# Patient Record
Sex: Male | Born: 1945 | Race: White | Hispanic: No | Smoking: Former smoker
Health system: Southern US, Community
[De-identification: ages and names within clinical notes are randomized; demographics above are authoritative.]

## PROBLEM LIST (undated history)

## (undated) DIAGNOSIS — K219 Gastro-esophageal reflux disease without esophagitis: Secondary | ICD-10-CM

## (undated) DIAGNOSIS — M4802 Spinal stenosis, cervical region: Secondary | ICD-10-CM

## (undated) DIAGNOSIS — D369 Benign neoplasm, unspecified site: Secondary | ICD-10-CM

## (undated) DIAGNOSIS — E119 Type 2 diabetes mellitus without complications: Secondary | ICD-10-CM

## (undated) DIAGNOSIS — C61 Malignant neoplasm of prostate: Secondary | ICD-10-CM

## (undated) DIAGNOSIS — K5790 Diverticulosis of intestine, part unspecified, without perforation or abscess without bleeding: Secondary | ICD-10-CM

## (undated) DIAGNOSIS — G629 Polyneuropathy, unspecified: Secondary | ICD-10-CM

## (undated) DIAGNOSIS — N529 Male erectile dysfunction, unspecified: Secondary | ICD-10-CM

## (undated) DIAGNOSIS — E785 Hyperlipidemia, unspecified: Secondary | ICD-10-CM

## (undated) DIAGNOSIS — H9319 Tinnitus, unspecified ear: Secondary | ICD-10-CM

## (undated) DIAGNOSIS — N400 Enlarged prostate without lower urinary tract symptoms: Secondary | ICD-10-CM

## (undated) DIAGNOSIS — Z86010 Personal history of colonic polyps: Secondary | ICD-10-CM

## (undated) DIAGNOSIS — B009 Herpesviral infection, unspecified: Secondary | ICD-10-CM

## (undated) DIAGNOSIS — J449 Chronic obstructive pulmonary disease, unspecified: Secondary | ICD-10-CM

## (undated) DIAGNOSIS — N419 Inflammatory disease of prostate, unspecified: Secondary | ICD-10-CM

## (undated) HISTORY — DX: Hyperlipidemia, unspecified: E78.5

## (undated) HISTORY — DX: Malignant neoplasm of prostate: C61

## (undated) HISTORY — DX: Diverticulosis of intestine, part unspecified, without perforation or abscess without bleeding: K57.90

## (undated) HISTORY — DX: Chronic obstructive pulmonary disease, unspecified: J44.9

## (undated) HISTORY — DX: Benign neoplasm, unspecified site: D36.9

## (undated) HISTORY — DX: Male erectile dysfunction, unspecified: N52.9

## (undated) HISTORY — DX: Polyneuropathy, unspecified: G62.9

## (undated) HISTORY — DX: Inflammatory disease of prostate, unspecified: N41.9

## (undated) HISTORY — DX: Gastro-esophageal reflux disease without esophagitis: K21.9

## (undated) HISTORY — DX: Spinal stenosis, cervical region: M48.02

## (undated) HISTORY — DX: Tinnitus, unspecified ear: H93.19

## (undated) HISTORY — DX: Type 2 diabetes mellitus without complications: E11.9

## (undated) HISTORY — DX: Herpesviral infection, unspecified: B00.9

## (undated) HISTORY — PX: COLONOSCOPY: SHX174

## (undated) HISTORY — DX: Benign prostatic hyperplasia without lower urinary tract symptoms: N40.0

---
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
## 2000-12-01 ENCOUNTER — Encounter: Payer: Self-pay | Admitting: Family Medicine

## 2000-12-01 ENCOUNTER — Encounter: Admission: RE | Admit: 2000-12-01 | Discharge: 2000-12-01 | Payer: Self-pay | Admitting: Family Medicine

## 2003-02-09 DIAGNOSIS — Z86010 Personal history of colonic polyps: Secondary | ICD-10-CM

## 2003-02-09 DIAGNOSIS — Z860101 Personal history of adenomatous and serrated colon polyps: Secondary | ICD-10-CM

## 2003-02-09 HISTORY — DX: Personal history of adenomatous and serrated colon polyps: Z86.0101

## 2003-02-09 HISTORY — DX: Personal history of colonic polyps: Z86.010

## 2008-07-02 ENCOUNTER — Encounter (INDEPENDENT_AMBULATORY_CARE_PROVIDER_SITE_OTHER): Payer: Self-pay | Admitting: *Deleted

## 2008-08-26 ENCOUNTER — Telehealth: Payer: Self-pay | Admitting: Internal Medicine

## 2008-08-26 ENCOUNTER — Ambulatory Visit: Payer: Self-pay | Admitting: Internal Medicine

## 2008-09-09 ENCOUNTER — Ambulatory Visit: Payer: Self-pay | Admitting: Internal Medicine

## 2008-09-09 ENCOUNTER — Encounter: Payer: Self-pay | Admitting: Internal Medicine

## 2008-09-12 ENCOUNTER — Encounter: Payer: Self-pay | Admitting: Internal Medicine

## 2009-06-24 ENCOUNTER — Encounter: Admission: RE | Admit: 2009-06-24 | Discharge: 2009-06-24 | Payer: Self-pay | Admitting: Family Medicine

## 2009-09-23 ENCOUNTER — Encounter: Admission: RE | Admit: 2009-09-23 | Discharge: 2009-09-23 | Payer: Self-pay | Admitting: Family Medicine

## 2009-09-25 ENCOUNTER — Encounter: Admission: RE | Admit: 2009-09-25 | Discharge: 2009-09-25 | Payer: Self-pay | Admitting: Family Medicine

## 2009-10-02 ENCOUNTER — Encounter: Admission: RE | Admit: 2009-10-02 | Discharge: 2009-10-02 | Payer: Self-pay | Admitting: Family Medicine

## 2010-02-08 DIAGNOSIS — L570 Actinic keratosis: Secondary | ICD-10-CM

## 2010-02-08 HISTORY — DX: Actinic keratosis: L57.0

## 2011-10-22 ENCOUNTER — Ambulatory Visit
Admission: RE | Admit: 2011-10-22 | Discharge: 2011-10-22 | Disposition: A | Discharge status: Home / Self Care | Payer: Self-pay | Source: Ambulatory Visit | Attending: Family Medicine | Admitting: Family Medicine

## 2011-10-22 ENCOUNTER — Other Ambulatory Visit: Payer: Self-pay | Admitting: Family Medicine

## 2011-10-22 DIAGNOSIS — M79672 Pain in left foot: Secondary | ICD-10-CM

## 2012-03-27 ENCOUNTER — Other Ambulatory Visit: Payer: Self-pay | Admitting: Family Medicine

## 2012-03-27 DIAGNOSIS — Z122 Encounter for screening for malignant neoplasm of respiratory organs: Secondary | ICD-10-CM

## 2012-03-30 ENCOUNTER — Ambulatory Visit
Admission: RE | Admit: 2012-03-30 | Discharge: 2012-03-30 | Disposition: A | Discharge status: Home / Self Care | Payer: 59 | Source: Ambulatory Visit | Attending: Family Medicine | Admitting: Family Medicine

## 2012-03-30 DIAGNOSIS — Z122 Encounter for screening for malignant neoplasm of respiratory organs: Secondary | ICD-10-CM

## 2013-04-27 ENCOUNTER — Ambulatory Visit
Admission: RE | Admit: 2013-04-27 | Discharge: 2013-04-27 | Disposition: A | Discharge status: Home / Self Care | Payer: 59 | Source: Ambulatory Visit | Attending: Family Medicine | Admitting: Family Medicine

## 2013-04-27 ENCOUNTER — Other Ambulatory Visit: Payer: Self-pay | Admitting: Family Medicine

## 2013-04-27 DIAGNOSIS — M79671 Pain in right foot: Secondary | ICD-10-CM

## 2013-10-22 ENCOUNTER — Encounter: Payer: Self-pay | Admitting: Internal Medicine

## 2013-11-29 ENCOUNTER — Other Ambulatory Visit: Payer: Self-pay | Admitting: Family Medicine

## 2013-11-29 DIAGNOSIS — M5412 Radiculopathy, cervical region: Secondary | ICD-10-CM

## 2013-11-29 DIAGNOSIS — M5416 Radiculopathy, lumbar region: Secondary | ICD-10-CM

## 2013-12-05 ENCOUNTER — Ambulatory Visit
Admission: RE | Admit: 2013-12-05 | Discharge: 2013-12-05 | Disposition: A | Discharge status: Home / Self Care | Payer: 59 | Source: Ambulatory Visit | Attending: Family Medicine | Admitting: Family Medicine

## 2013-12-05 ENCOUNTER — Other Ambulatory Visit: Payer: Self-pay | Admitting: Family Medicine

## 2013-12-05 DIAGNOSIS — Z139 Encounter for screening, unspecified: Secondary | ICD-10-CM

## 2013-12-07 ENCOUNTER — Other Ambulatory Visit: Payer: Self-pay

## 2013-12-07 ENCOUNTER — Ambulatory Visit
Admission: RE | Admit: 2013-12-07 | Discharge: 2013-12-07 | Disposition: A | Discharge status: Home / Self Care | Payer: 59 | Source: Ambulatory Visit | Attending: Family Medicine | Admitting: Family Medicine

## 2013-12-07 DIAGNOSIS — M5412 Radiculopathy, cervical region: Secondary | ICD-10-CM

## 2013-12-07 DIAGNOSIS — M5416 Radiculopathy, lumbar region: Secondary | ICD-10-CM

## 2013-12-14 ENCOUNTER — Other Ambulatory Visit: Payer: Self-pay | Admitting: Family Medicine

## 2013-12-14 DIAGNOSIS — M4802 Spinal stenosis, cervical region: Secondary | ICD-10-CM

## 2013-12-14 DIAGNOSIS — M5412 Radiculopathy, cervical region: Secondary | ICD-10-CM

## 2013-12-17 ENCOUNTER — Other Ambulatory Visit: Payer: Self-pay | Admitting: Family Medicine

## 2013-12-17 DIAGNOSIS — M5416 Radiculopathy, lumbar region: Secondary | ICD-10-CM

## 2013-12-19 ENCOUNTER — Ambulatory Visit
Admission: RE | Admit: 2013-12-19 | Discharge: 2013-12-19 | Disposition: A | Discharge status: Home / Self Care | Payer: 59 | Source: Ambulatory Visit | Attending: Family Medicine | Admitting: Family Medicine

## 2013-12-19 DIAGNOSIS — M5412 Radiculopathy, cervical region: Secondary | ICD-10-CM

## 2013-12-19 DIAGNOSIS — M4802 Spinal stenosis, cervical region: Secondary | ICD-10-CM

## 2013-12-19 MED ORDER — IOHEXOL 300 MG/ML  SOLN
1.0000 mL | Freq: Once | INTRAMUSCULAR | Status: AC | PRN
  Administered 2013-12-19: 1 mL via EPIDURAL

## 2013-12-19 MED ORDER — TRIAMCINOLONE ACETONIDE 40 MG/ML IJ SUSP (RADIOLOGY)
60.0000 mg | Freq: Once | INTRAMUSCULAR | Status: AC
  Administered 2013-12-19: 60 mg via EPIDURAL

## 2013-12-19 NOTE — Discharge Instructions (Signed)

## 2014-01-07 ENCOUNTER — Ambulatory Visit
Admission: RE | Admit: 2014-01-07 | Discharge: 2014-01-07 | Disposition: A | Discharge status: Home / Self Care | Payer: 59 | Source: Ambulatory Visit | Attending: Family Medicine | Admitting: Family Medicine

## 2014-01-07 DIAGNOSIS — M5416 Radiculopathy, lumbar region: Secondary | ICD-10-CM

## 2014-01-07 MED ORDER — METHYLPREDNISOLONE ACETATE 40 MG/ML INJ SUSP (RADIOLOG
120.0000 mg | Freq: Once | INTRAMUSCULAR | Status: AC
  Administered 2014-01-07: 120 mg via EPIDURAL

## 2014-01-07 MED ORDER — IOHEXOL 180 MG/ML  SOLN
1.0000 mL | Freq: Once | INTRAMUSCULAR | Status: AC | PRN
Start: 2014-01-07 — End: 2014-01-07
  Administered 2014-01-07: 1 mL via EPIDURAL

## 2016-03-02 ENCOUNTER — Other Ambulatory Visit: Payer: Self-pay | Admitting: Family Medicine

## 2016-03-02 DIAGNOSIS — G8929 Other chronic pain: Secondary | ICD-10-CM

## 2016-03-02 DIAGNOSIS — M533 Sacrococcygeal disorders, not elsewhere classified: Principal | ICD-10-CM

## 2016-03-17 ENCOUNTER — Ambulatory Visit
Admission: RE | Admit: 2016-03-17 | Discharge: 2016-03-17 | Disposition: A | Discharge status: Home / Self Care | Payer: 59 | Source: Ambulatory Visit | Attending: Family Medicine | Admitting: Family Medicine

## 2016-03-17 DIAGNOSIS — G8929 Other chronic pain: Secondary | ICD-10-CM

## 2016-03-17 DIAGNOSIS — M533 Sacrococcygeal disorders, not elsewhere classified: Principal | ICD-10-CM

## 2016-03-17 MED ORDER — IOPAMIDOL (ISOVUE-M 200) INJECTION 41%
1.0000 mL | Freq: Once | INTRAMUSCULAR | Status: AC
  Administered 2016-03-17: 1 mL via INTRA_ARTICULAR

## 2016-03-17 MED ORDER — METHYLPREDNISOLONE ACETATE 40 MG/ML INJ SUSP (RADIOLOG
120.0000 mg | Freq: Once | INTRAMUSCULAR | Status: AC
  Administered 2016-03-17: 120 mg via INTRA_ARTICULAR

## 2016-03-17 NOTE — Discharge Instructions (Signed)

## 2016-12-15 ENCOUNTER — Other Ambulatory Visit: Payer: Self-pay | Admitting: Family Medicine

## 2016-12-15 ENCOUNTER — Ambulatory Visit
Admission: RE | Admit: 2016-12-15 | Discharge: 2016-12-15 | Disposition: A | Discharge status: Home / Self Care | Payer: 59 | Source: Ambulatory Visit | Attending: Family Medicine | Admitting: Family Medicine

## 2016-12-15 DIAGNOSIS — M25471 Effusion, right ankle: Secondary | ICD-10-CM

## 2017-12-20 ENCOUNTER — Other Ambulatory Visit: Payer: Self-pay | Admitting: Urology

## 2017-12-20 DIAGNOSIS — R972 Elevated prostate specific antigen [PSA]: Secondary | ICD-10-CM

## 2018-01-10 ENCOUNTER — Ambulatory Visit
Admission: RE | Admit: 2018-01-10 | Discharge: 2018-01-10 | Disposition: A | Discharge status: Home / Self Care | Payer: 59 | Source: Ambulatory Visit | Attending: Urology | Admitting: Urology

## 2018-01-10 DIAGNOSIS — R972 Elevated prostate specific antigen [PSA]: Secondary | ICD-10-CM

## 2018-01-10 MED ORDER — GADOBENATE DIMEGLUMINE 529 MG/ML IV SOLN
16.0000 mL | Freq: Once | INTRAVENOUS | Status: AC | PRN
  Administered 2018-01-10: 16 mL via INTRAVENOUS

## 2018-03-15 ENCOUNTER — Ambulatory Visit
Admission: RE | Admit: 2018-03-15 | Discharge: 2018-03-15 | Disposition: A | Discharge status: Home / Self Care | Payer: 59 | Source: Ambulatory Visit | Attending: Family Medicine | Admitting: Family Medicine

## 2018-03-15 ENCOUNTER — Other Ambulatory Visit: Payer: Self-pay | Admitting: Family Medicine

## 2018-03-15 DIAGNOSIS — R059 Cough, unspecified: Secondary | ICD-10-CM

## 2018-03-15 DIAGNOSIS — R05 Cough: Secondary | ICD-10-CM

## 2018-09-12 ENCOUNTER — Encounter: Payer: Self-pay | Admitting: Internal Medicine

## 2018-09-25 ENCOUNTER — Encounter: Payer: Self-pay | Admitting: Internal Medicine

## 2018-10-03 ENCOUNTER — Other Ambulatory Visit: Payer: Self-pay

## 2018-10-03 ENCOUNTER — Ambulatory Visit (AMBULATORY_SURGERY_CENTER): Payer: Self-pay | Admitting: *Deleted

## 2018-10-03 VITALS — Temp 96.9°F | Ht 68.0 in | Wt 179.0 lb

## 2018-10-03 DIAGNOSIS — Z1211 Encounter for screening for malignant neoplasm of colon: Secondary | ICD-10-CM

## 2018-10-03 DIAGNOSIS — Z1212 Encounter for screening for malignant neoplasm of rectum: Secondary | ICD-10-CM

## 2018-10-03 NOTE — Progress Notes (Signed)
No egg or soy allergy known to patient  No issues with past sedation with any surgeries  or procedures, no intubation problems  No diet pills per patient No home 02 use per patient  No blood thinners per patient  Pt denies issues with constipation  No A fib or A flutter  EMMI video declined - pt has e mail

## 2018-10-09 ENCOUNTER — Encounter: Payer: Self-pay | Admitting: Internal Medicine

## 2018-10-16 ENCOUNTER — Telehealth: Payer: Self-pay

## 2018-10-16 NOTE — Telephone Encounter (Signed)
Patient answered "no" to all questions.  

## 2018-10-16 NOTE — Telephone Encounter (Signed)
Covid-19 screening questions   Do you now or have you had a fever in the last 14 days?  Do you have any respiratory symptoms of shortness of breath or cough now or in the last 14 days?  Do you have any family members or close contacts with diagnosed or suspected Covid-19 in the past 14 days?  Have you been tested for Covid-19 and found to be positive?       

## 2018-10-17 ENCOUNTER — Encounter: Payer: Self-pay | Admitting: Internal Medicine

## 2018-10-17 ENCOUNTER — Other Ambulatory Visit: Payer: Self-pay

## 2018-10-17 ENCOUNTER — Ambulatory Visit (AMBULATORY_SURGERY_CENTER): Payer: 59 | Admitting: Internal Medicine

## 2018-10-17 VITALS — BP 159/93 | HR 49 | Temp 97.6°F | Resp 21 | Ht 68.0 in | Wt 179.0 lb

## 2018-10-17 DIAGNOSIS — D123 Benign neoplasm of transverse colon: Secondary | ICD-10-CM

## 2018-10-17 DIAGNOSIS — Z86010 Personal history of colonic polyps: Secondary | ICD-10-CM | POA: Diagnosis present

## 2018-10-17 MED ORDER — SODIUM CHLORIDE 0.9 % IV SOLN: 500.0000 mL | Freq: Once | INTRAVENOUS | Status: DC

## 2018-10-17 NOTE — Op Note (Signed)
[facility] Patient Name: Sean Robertson Procedure Date: 10/17/2018 9:24 AM MRN: [id number] Endoscopist: Gatha Mayer , MD Age: 73 Referring MD:  Date of Birth: [birth year] Gender: Male Account #: [id number] Procedure:                Colonoscopy Indications:              Surveillance: Personal history of adenomatous                            polyps on last colonoscopy > 5 years ago Medicines:                Propofol per Anesthesia, Monitored Anesthesia Care Procedure:                Pre-Anesthesia Assessment:                           - Prior to the procedure, a History and Physical                            was performed, and patient medications and                            allergies were reviewed. The patient's tolerance of                            previous anesthesia was also reviewed. The risks                            and benefits of the procedure and the sedation                            options and risks were discussed with the patient.                            All questions were answered, and informed consent                            was obtained. Prior Anticoagulants: The patient has                            taken no previous anticoagulant or antiplatelet                            agents. ASA Grade Assessment: II - A patient with                            mild systemic disease. After reviewing the risks                            and benefits, the patient was deemed in                            satisfactory condition to undergo the procedure.  After obtaining informed consent, the colonoscope                            was passed under direct vision. Throughout the                            procedure, the patient's blood pressure, pulse, and                            oxygen saturations were monitored continuously. The                            Colonoscope was introduced through the anus and   advanced to the the cecum, identified by                            appendiceal orifice and ileocecal valve. The                            colonoscopy was performed without difficulty. The                            patient tolerated the procedure well. The quality                            of the bowel preparation was excellent. The bowel                            preparation used was Miralax via split dose                            instruction. The ileocecal valve, appendiceal                            orifice, and rectum were photographed. Scope In: 9:30:48 AM Scope Out: 9:39:29 AM Scope Withdrawal Time: 0 hours 6 minutes 14 seconds  Total Procedure Duration: 0 hours 8 minutes 41 seconds  Findings:                 The digital rectal exam findings include enlarged                            prostate. Pertinent negatives include no palpable                            rectal lesions.                           A diminutive polyp was found in the proximal                            transverse colon. The polyp was sessile. The polyp                            was removed with a cold snare. Resection  and                            retrieval were complete. Verification of patient                            identification for the specimen was done. Estimated                            blood loss was minimal.                           Multiple diverticula were found in the sigmoid                            colon.                           External and internal hemorrhoids were found.                           Skin tags were found on perianal exam.                           The exam was otherwise without abnormality on                            direct and retroflexion views. Complications:            No immediate complications. Estimated Blood Loss:     Estimated blood loss was minimal. Impression:               - Enlarged prostate found on digital rectal exam.                             F/U PCP and/or urology                           - One diminutive polyp in the proximal transverse                            colon, removed with a cold snare. Resected and                            retrieved.                           - Diverticulosis in the sigmoid colon.                           - External and internal hemorrhoids.                           - Perianal skin tags found on perianal exam.                           - The examination was otherwise normal on direct  and retroflexion views.                           - Personal history of colonic polyps. 2 diminutive                            adenomas 20-05, no polyps 2010 Recommendation:           - Patient has a contact number available for                            emergencies. The signs and symptoms of potential                            delayed complications were discussed with the                            patient. Return to normal activities tomorrow.                            Written discharge instructions were provided to the                            patient.                           - Resume previous diet.                           - Continue present medications.                           - No recommendation at this time regarding repeat                            colonoscopy due to age. Anticipate tyhat he will                            not need a routine repeat at his age and with 1                            diminutive polyp today - will let him know. Gatha Mayer, MD 10/17/2018 9:48:55 AM This report has been signed electronically.

## 2018-10-17 NOTE — Progress Notes (Signed)
Report to PACU, RN, vss, BBS= Clear.  

## 2018-10-17 NOTE — Patient Instructions (Addendum)
I found and removed one tiny polyp. I will let you know pathology results and if/when to have another routine colonoscopy by mail and/or My Chart. I am thinking I will not likely recommend a repeat colonoscopy.  You do have diverticulosis - thickened muscle rings and pouches in the colon wall. Please read the handout about this condition.  I also saw swollen hemorrhoids, which could be from the prep. If you have hemorrhoid problems (swelling, itching, bleeding) I am able to treat those with an in-office procedure. If you like, please call my office at [phone] to schedule an appointment and I can evaluate you further.  Handouts given for hemorrhoids and hemorrhoid removal, polyps, and diverticulosis.   YOU HAD AN ENDOSCOPIC PROCEDURE TODAY AT [facility]:   Refer to the procedure report that was given to you for any specific questions about what was found during the examination.  If the procedure report does not answer your questions, please call your gastroenterologist to clarify.  If you requested that your care partner not be given the details of your procedure findings, then the procedure report has been included in a sealed envelope for you to review at your convenience later.  YOU SHOULD EXPECT: Some feelings of bloating in the abdomen. Passage of more gas than usual.  Walking can help get rid of the air that was put into your GI tract during the procedure and reduce the bloating. If you had a lower endoscopy (such as a colonoscopy or flexible sigmoidoscopy) you may notice spotting of blood in your stool or on the toilet paper. If you underwent a bowel prep for your procedure, you may not have a normal bowel movement for a few days.  Please Note:  You might notice some irritation and congestion in your nose or some drainage.  This is from the oxygen used during your procedure.  There is no need for concern and it should clear up in a day or so.  SYMPTOMS TO REPORT  IMMEDIATELY:   Following lower endoscopy (colonoscopy or flexible sigmoidoscopy):  Excessive amounts of blood in the stool  Significant tenderness or worsening of abdominal pains  Swelling of the abdomen that is new, acute  Fever of 100F or higher  For urgent or emergent issues, a gastroenterologist can be reached at any hour by calling [phone].   DIET:  We do recommend a small meal at first, but then you may proceed to your regular diet.  Drink plenty of fluids but you should avoid alcoholic beverages for 24 hours.  ACTIVITY:  You should plan to take it easy for the rest of today and you should NOT DRIVE or use heavy machinery until tomorrow (because of the sedation medicines used during the test).    FOLLOW UP: Our staff will call the number listed on your records 48-72 hours following your procedure to check on you and address any questions or concerns that you may have regarding the information given to you following your procedure. If we do not reach you, we will leave a message.  We will attempt to reach you two times.  During this call, we will ask if you have developed any symptoms of COVID 19. If you develop any symptoms (ie: fever, flu-like symptoms, shortness of breath, cough etc.) before then, please call [phone].  If you test positive for Covid 19 in the 2 weeks post procedure, please call and report this information to [name].    If any biopsies were  taken you will be contacted by phone or by letter within the next 1-3 weeks.  Please call us at [phone] if you have not heard about the biopsies in 3 weeks.    SIGNATURES/CONFIDENTIALITY: You and/or your care partner have signed paperwork which will be entered into your electronic medical record.  These signatures attest to the fact that that the information above on your After Visit Summary has been reviewed and is understood.  Full responsibility of the confidentiality of this discharge information lies with you  and/or your care-partner.

## 2018-10-17 NOTE — Progress Notes (Signed)
Pt's states no medical or surgical changes since previsit or office visit.  AM - temp CW - vitals 

## 2018-10-17 NOTE — Progress Notes (Signed)
Called to room to assist during endoscopic procedure.  Patient ID and intended procedure confirmed with present staff. Received instructions for my participation in the procedure from the performing physician.  

## 2018-10-19 ENCOUNTER — Encounter: Payer: Self-pay | Admitting: Internal Medicine

## 2018-10-19 ENCOUNTER — Telehealth: Payer: Self-pay

## 2018-10-19 NOTE — Telephone Encounter (Signed)
  Follow up Call-  Call back number 10/17/2018  Post procedure Call Back phone  # [phone]  Permission to leave phone message Yes  Some recent data might be hidden     Patient questions:  Do you have a fever, pain , or abdominal swelling? No. Pain Score  0 *  Have you tolerated food without any problems? Yes.    Have you been able to return to your normal activities? Yes.    Do you have any questions about your discharge instructions: Diet   No. Medications  No. Follow up visit  No.  Do you have questions or concerns about your Care? No.  Actions: * If pain score is 4 or above: No action needed, pain <4.  1. Have you developed a fever since your procedure? Mo.  2.   Have you had an respiratory symptoms (SOB or cough) since your procedure? No.  3.   Have you tested positive for COVID 19 since your procedure no.  4.   Have you had any family members/close contacts diagnosed with the COVID 19 since your procedure?  No.   If yes to any of these questions please route to Joylene John, RN and Alphonsa Gin, RN.

## 2018-10-19 NOTE — Progress Notes (Signed)
Diminutive adenoma No recall due to age and only 1 diminutive adenoma

## 2020-06-09 ENCOUNTER — Telehealth: Payer: Self-pay | Admitting: Internal Medicine

## 2020-07-01 ENCOUNTER — Other Ambulatory Visit: Payer: Self-pay

## 2020-07-11 ENCOUNTER — Ambulatory Visit: Payer: 59 | Admitting: Internal Medicine

## 2020-07-11 ENCOUNTER — Encounter: Payer: Self-pay | Admitting: Internal Medicine

## 2020-07-11 VITALS — BP 150/60 | HR 68 | Ht 68.0 in | Wt 173.4 lb

## 2020-07-11 DIAGNOSIS — R110 Nausea: Secondary | ICD-10-CM

## 2020-07-11 DIAGNOSIS — R195 Other fecal abnormalities: Secondary | ICD-10-CM

## 2020-07-11 DIAGNOSIS — R630 Anorexia: Secondary | ICD-10-CM

## 2020-07-11 DIAGNOSIS — N289 Disorder of kidney and ureter, unspecified: Secondary | ICD-10-CM

## 2020-07-11 DIAGNOSIS — R1013 Epigastric pain: Secondary | ICD-10-CM

## 2020-07-11 DIAGNOSIS — Z636 Dependent relative needing care at home: Secondary | ICD-10-CM
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
## 2020-07-11 NOTE — Patient Instructions (Signed)
You have been scheduled for an endoscopy. Please follow written instructions given to you at your visit today. If you use inhalers (even only as needed), please bring them with you on the day of your procedure.  Due to recent changes in healthcare laws, you may see the results of your imaging and laboratory studies on MyChart before your provider has had a chance to review them.  We understand that in some cases there may be results that are confusing or concerning to you. Not all laboratory results come back in the same time frame and the provider may be waiting for multiple results in order to interpret others.  Please give [name] 48 hours in order for your provider to thoroughly review all the results before contacting the office for clarification of your results.   If you are age 20 or older, your body mass index should be between 23-30. Your Body mass index is 26.36 kg/m. If this is out of the aforementioned range listed, please consider follow up with your Primary Care Provider.  The [facility] providers would like to encourage you to use [facility] to communicate with providers for non-urgent requests or questions.  Due to long hold times on the telephone, sending your provider a message by [facility] may be a faster and more efficient way to get a response.  Please allow 48 business hours for a response.  Please remember that this is for non-urgent requests.   I appreciate the opportunity to care for you. Silvano Rusk, MD, [facility]

## 2020-07-14 ENCOUNTER — Telehealth: Payer: Self-pay

## 2020-07-14 DIAGNOSIS — N289 Disorder of kidney and ureter, unspecified: Secondary | ICD-10-CM

## 2020-07-14 NOTE — Telephone Encounter (Signed)
Sean Robertson informed to come for lab work here and his Renal U/S is set up for 07/22/20 arrive at 2:45pm at [facility] , [address] , arrive with a full bladder, don't go to the bathroom one hour prior to appointment. Informed of the $75 no show fee.

## 2020-07-14 NOTE — Telephone Encounter (Signed)
-----   Message from Gatha Mayer, MD sent at 07/11/2020  5:53 PM EDT ----- Regarding: Additional tests As I was dictating I determined he needs the following:  #1 BME T  #2 renal ultrasound  The diagnosis for both of these is worsening renal function which was a diagnosis I used on the visit from today

## 2020-07-15 ENCOUNTER — Other Ambulatory Visit (INDEPENDENT_AMBULATORY_CARE_PROVIDER_SITE_OTHER): Payer: 59

## 2020-07-15 DIAGNOSIS — N289 Disorder of kidney and ureter, unspecified: Secondary | ICD-10-CM

## 2020-07-15 LAB — BASIC METABOLIC PANEL
BUN: 18 mg/dL (ref 6–23)
CO2: 26 mEq/L (ref 19–32)
Calcium: 9.6 mg/dL (ref 8.4–10.5)
Chloride: 99 mEq/L (ref 96–112)
Creatinine, Ser: 1.6 mg/dL — ABNORMAL HIGH (ref 0.40–1.50)
GFR: 42.05 mL/min — ABNORMAL LOW (ref >60.00)
Glucose, Bld: 214 mg/dL — ABNORMAL HIGH (ref 70–99)
Potassium: 3.7 mEq/L (ref 3.5–5.1)
Sodium: 135 mEq/L (ref 135–145)

## 2020-07-22 ENCOUNTER — Ambulatory Visit
Admission: RE | Admit: 2020-07-22 | Discharge: 2020-07-22 | Disposition: A | Discharge status: Home / Self Care | Payer: 59 | Source: Ambulatory Visit | Attending: Internal Medicine | Admitting: Internal Medicine

## 2020-07-22 ENCOUNTER — Encounter: Payer: Self-pay | Admitting: Internal Medicine

## 2020-07-22 DIAGNOSIS — N289 Disorder of kidney and ureter, unspecified: Secondary | ICD-10-CM

## 2020-07-23 ENCOUNTER — Encounter: Payer: Self-pay | Admitting: Certified Registered Nurse Anesthetist

## 2020-07-24 ENCOUNTER — Other Ambulatory Visit: Payer: Self-pay

## 2020-07-24 ENCOUNTER — Ambulatory Visit (AMBULATORY_SURGERY_CENTER): Payer: 59 | Admitting: Internal Medicine

## 2020-07-24 ENCOUNTER — Encounter: Payer: Self-pay | Admitting: Internal Medicine

## 2020-07-24 VITALS — BP 116/62 | HR 60 | Temp 97.1°F | Resp 14 | Ht 68.0 in | Wt 173.0 lb

## 2020-07-24 DIAGNOSIS — K2970 Gastritis, unspecified, without bleeding: Secondary | ICD-10-CM | POA: Insufficient documentation

## 2020-07-24 DIAGNOSIS — B9681 Helicobacter pylori [H. pylori] as the cause of diseases classified elsewhere: Secondary | ICD-10-CM

## 2020-07-24 DIAGNOSIS — R110 Nausea: Secondary | ICD-10-CM

## 2020-07-24 DIAGNOSIS — K2960 Other gastritis without bleeding: Secondary | ICD-10-CM

## 2020-07-24 DIAGNOSIS — R1013 Epigastric pain: Secondary | ICD-10-CM

## 2020-07-24 HISTORY — DX: Gastritis, unspecified, without bleeding: K29.70

## 2020-07-24 HISTORY — DX: Helicobacter pylori (H. pylori) as the cause of diseases classified elsewhere: B96.81

## 2020-07-24 MED ORDER — SODIUM CHLORIDE 0.9 % IV SOLN: 500.0000 mL | Freq: Once | INTRAVENOUS | Status: DC

## 2020-07-24 NOTE — Progress Notes (Signed)
Report given to PACU, vss 

## 2020-07-24 NOTE — Progress Notes (Signed)
Medical history reviewed with no changes noted. VS assessed by S.F, RN

## 2020-07-24 NOTE — Progress Notes (Signed)
Called to room to assist during endoscopic procedure.  Patient ID and intended procedure confirmed with present staff. Received instructions for my participation in the procedure from the performing physician.  

## 2020-07-24 NOTE — Progress Notes (Signed)
0730 Robinul 0.1 mg IV given due large amount of secretions upon assessment.  MD made aware, vss 

## 2020-07-24 NOTE — Op Note (Signed)
[facility] Patient Name: Sean Robertson Procedure Date: 07/24/2020 7:31 AM MRN: [id number] Endoscopist: Gatha Mayer , MD Age: 75 Referring MD:  Date of Birth: [birth year] Gender: Male Account #: [id number] Procedure:                Upper GI endoscopy Indications:              Periumbilical abdominal pain, Dyspepsia, Heartburn,                            Heme positive stool, Anorexia, Nausea Medicines:                Propofol per Anesthesia, Monitored Anesthesia Care Procedure:                Pre-Anesthesia Assessment:                           - Prior to the procedure, a History and Physical                            was performed, and patient medications and                            allergies were reviewed. The patient's tolerance of                            previous anesthesia was also reviewed. The risks                            and benefits of the procedure and the sedation                            options and risks were discussed with the patient.                            All questions were answered, and informed consent                            was obtained. Prior Anticoagulants: The patient has                            taken no previous anticoagulant or antiplatelet                            agents. ASA Grade Assessment: III - A patient with                            severe systemic disease. After reviewing the risks                            and benefits, the patient was deemed in                            satisfactory condition to undergo the procedure.  After obtaining informed consent, the endoscope was                            passed under direct vision. Throughout the                            procedure, the patient's blood pressure, pulse, and                            oxygen saturations were monitored continuously. The                            Endoscope was introduced through the mouth, and                             advanced to the second part of duodenum. The upper                            GI endoscopy was accomplished without difficulty.                            The patient tolerated the procedure well. Scope In: Scope Out: Findings:                 Diffuse mild inflammation characterized by                            congestion (edema), erythema and mucus was found in                            the gastric body and in the gastric antrum.                            Biopsies were taken with a cold forceps for                            histology. Verification of patient identification                            for the specimen was done. Estimated blood loss was                            minimal.                           The exam was otherwise without abnormality.                           The cardia and gastric fundus were normal on                            retroflexion. Complications:            No immediate complications. Estimated Blood Loss:     Estimated blood loss was minimal. Impression:               -  Gastritis. Biopsied.                           - The examination was otherwise normal. Recommendation:           - Patient has a contact number available for                            emergencies. The signs and symptoms of potential                            delayed complications were discussed with the                            patient. Return to normal activities tomorrow.                            Written discharge instructions were provided to the                            patient.                           - Resume previous diet.                           - Continue present medications.                           - Await pathology results.                           - See Dr. Sandi Mariscal regarding abnormal renal                            function.                           Renal US was normal.                           No cause of heme + stool - had colonoscopy  2020.                           ? stress - playing a role Gatha Mayer, MD 07/24/2020 7:55:47 AM This report has been signed electronically.

## 2020-07-24 NOTE — Patient Instructions (Addendum)
HANDOUTS PROVIDED:  GASTRITIS  There is some mild color change to the stomach lining suggesting inflammation called gastritis.  I took biopsies.  I did not see anything bad.   I will let you know.  Please go back to Dr. Sandi Mariscal and follow up regarding mildly abnormal kidney function.  I appreciate the opportunity to care for you. Gatha Mayer, MD, FACG  YOU HAD AN ENDOSCOPIC PROCEDURE TODAY AT [facility]:   Refer to the procedure report that was given to you for any specific questions about what was found during the examination.  If the procedure report does not answer your questions, please call your gastroenterologist to clarify.  If you requested that your care partner not be given the details of your procedure findings, then the procedure report has been included in a sealed envelope for you to review at your convenience later.  YOU SHOULD EXPECT: Some feelings of bloating in the abdomen. Passage of more gas than usual.  Walking can help get rid of the air that was put into your GI tract during the procedure and reduce the bloating.   Please Note:  You might notice some irritation and congestion in your nose or some drainage.  This is from the oxygen used during your procedure.  There is no need for concern and it should clear up in a day or so.  SYMPTOMS TO REPORT IMMEDIATELY:  Following upper endoscopy (EGD)  Vomiting of blood or coffee ground material  New chest pain or pain under the shoulder blades  Painful or persistently difficult swallowing  New shortness of breath  Fever of 100F or higher  Black, tarry-looking stools  For urgent or emergent issues, a gastroenterologist can be reached at any hour by calling [phone]. Do not use MyChart messaging for urgent concerns.    DIET:  We do recommend a small meal at first, but then you may proceed to your regular diet.  Drink plenty of fluids but you should avoid alcoholic beverages for 24  hours.  ACTIVITY:  You should plan to take it easy for the rest of today and you should NOT DRIVE or use heavy machinery until tomorrow (because of the sedation medicines used during the test).    FOLLOW UP: Our staff will call the number listed on your records Monday morning between 7:15 am and 8:15 am following your procedure to check on you and address any questions or concerns that you may have regarding the information given to you following your procedure. If we do not reach you, we will leave a message.  We will attempt to reach you two times.  During this call, we will ask if you have developed any symptoms of COVID 19. If you develop any symptoms (ie: fever, flu-like symptoms, shortness of breath, cough etc.) before then, please call [phone].  If you test positive for Covid 19 in the 2 weeks post procedure, please call and report this information to [name].    If any biopsies were taken you will be contacted by phone or by letter within the next 1-3 weeks.  Please call us at [phone] if you have not heard about the biopsies in 3 weeks.    SIGNATURES/CONFIDENTIALITY: You and/or your care partner have signed paperwork which will be entered into your electronic medical record.  These signatures attest to the fact that that the information above on your After Visit Summary has been reviewed and is understood.  Full responsibility of the  confidentiality of this discharge information lies with you and/or your care-partner.

## 2020-08-04 ENCOUNTER — Telehealth: Payer: Self-pay | Admitting: Internal Medicine

## 2020-08-04 DIAGNOSIS — A048 Other specified bacterial intestinal infections: Secondary | ICD-10-CM

## 2020-08-04 NOTE — Telephone Encounter (Signed)
Inbound call from patient requesting EGD results please.

## 2020-08-04 NOTE — Telephone Encounter (Signed)
Dr. Carlean Purl please review path. H pylori +.

## 2020-08-05 ENCOUNTER — Encounter: Payer: Self-pay | Admitting: Internal Medicine

## 2020-08-05 MED ORDER — BISMUTH SUBSALICYLATE 262 MG PO CHEW: 524.0000 mg | CHEWABLE_TABLET | Freq: Four times a day (QID) | ORAL | 0 refills | Status: AC

## 2020-08-05 MED ORDER — DOXYCYCLINE HYCLATE 100 MG PO CAPS: 100.0000 mg | ORAL_CAPSULE | Freq: Two times a day (BID) | ORAL | 0 refills | Status: AC

## 2020-08-05 MED ORDER — METRONIDAZOLE 250 MG PO TABS: 250.0000 mg | ORAL_TABLET | Freq: Four times a day (QID) | ORAL | 0 refills | Status: AC

## 2020-08-05 NOTE — Telephone Encounter (Signed)
1) Omeprazole 20 mg 2 times a day x 14 d 2) Pepto Bismol 2 tabs (262 mg each) 4 times a day x 14 d 3) Metronidazole 250 mg 4 times a day x 14 d 4) doxycycline 100 mg 2 times a day x 14 d  After 14 d stop omeprazole also  In 4 weeks after treatment completed do H. Pylori stool antigen - dx H. Pylori gastritis  

## 2020-08-05 NOTE — Telephone Encounter (Signed)
Patient notified of the results and the new orders. He is aware I will contact him by letter in August for repeat labs to confirm eradication and to remain off omeprazole after he completes the 14 day tx. Patient verbalized understanding that if he needs to be off PPI a full 2 weeks and antibiotics a month prior to retesting.,

## 2020-09-02 ENCOUNTER — Other Ambulatory Visit: Payer: Self-pay

## 2020-09-02 ENCOUNTER — Telehealth: Payer: Self-pay

## 2020-09-02 NOTE — Telephone Encounter (Signed)
PCP office called to update [name] on Sean Robertson's medicine and to let us know he didn't take but only a few of the pepto bismal with his H. Pylori treatment. Dr Sandi Mariscal wanted to know if he has to start over since he didn't take all the pepto bismal. I told them no, that part was just to protect his stomach from the other medicines.   Dr Sandi Mariscal has put him back on metformin because his sugars have been elevated and also put him on pantoprazole '40mg'$  BID for his GERD.

## 2020-09-15 ENCOUNTER — Other Ambulatory Visit: Payer: 59

## 2020-09-16 ENCOUNTER — Other Ambulatory Visit: Payer: 59

## 2020-09-16 DIAGNOSIS — A048 Other specified bacterial intestinal infections: Secondary | ICD-10-CM

## 2020-09-17 LAB — HELICOBACTER PYLORI  SPECIAL ANTIGEN
MICRO NUMBER:: 12219614
SPECIMEN QUALITY: ADEQUATE

## 2020-11-20 ENCOUNTER — Other Ambulatory Visit: Payer: Self-pay | Admitting: Family Medicine

## 2020-11-20 DIAGNOSIS — N183 Chronic kidney disease, stage 3 unspecified: Secondary | ICD-10-CM

## 2020-11-21 ENCOUNTER — Other Ambulatory Visit: Payer: Self-pay | Admitting: Family Medicine

## 2020-11-21 DIAGNOSIS — H5462 Unqualified visual loss, left eye, normal vision right eye: Secondary | ICD-10-CM

## 2020-11-21 DIAGNOSIS — R270 Ataxia, unspecified: Secondary | ICD-10-CM

## 2020-12-01 ENCOUNTER — Ambulatory Visit
Admission: RE | Admit: 2020-12-01 | Discharge: 2020-12-01 | Disposition: A | Discharge status: Home / Self Care | Payer: 59 | Source: Ambulatory Visit | Attending: Family Medicine | Admitting: Family Medicine

## 2020-12-01 DIAGNOSIS — N183 Chronic kidney disease, stage 3 unspecified: Secondary | ICD-10-CM

## 2020-12-18 ENCOUNTER — Ambulatory Visit
Admission: RE | Admit: 2020-12-18 | Discharge: 2020-12-18 | Disposition: A | Discharge status: Home / Self Care | Payer: 59 | Source: Ambulatory Visit | Attending: Family Medicine | Admitting: Family Medicine

## 2020-12-18 ENCOUNTER — Other Ambulatory Visit: Payer: Self-pay

## 2020-12-18 DIAGNOSIS — H5462 Unqualified visual loss, left eye, normal vision right eye: Secondary | ICD-10-CM

## 2020-12-18 DIAGNOSIS — R270 Ataxia, unspecified: Secondary | ICD-10-CM

## 2020-12-18 MED ORDER — GADOBENATE DIMEGLUMINE 529 MG/ML IV SOLN
15.0000 mL | Freq: Once | INTRAVENOUS | Status: AC | PRN
  Administered 2020-12-18: 15 mL via INTRAVENOUS

## 2020-12-25 ENCOUNTER — Encounter: Payer: Self-pay | Admitting: Neurology

## 2021-01-07 ENCOUNTER — Other Ambulatory Visit: Payer: Self-pay | Admitting: Urology

## 2021-01-07 DIAGNOSIS — C61 Malignant neoplasm of prostate: Secondary | ICD-10-CM

## 2021-02-07 ENCOUNTER — Other Ambulatory Visit: Payer: Self-pay

## 2021-02-07 ENCOUNTER — Ambulatory Visit
Admission: RE | Admit: 2021-02-07 | Discharge: 2021-02-07 | Disposition: A | Discharge status: Home / Self Care | Payer: 59 | Source: Ambulatory Visit | Attending: Urology | Admitting: Urology

## 2021-02-07 DIAGNOSIS — C61 Malignant neoplasm of prostate: Secondary | ICD-10-CM

## 2021-02-07 MED ORDER — GADOBENATE DIMEGLUMINE 529 MG/ML IV SOLN
15.0000 mL | Freq: Once | INTRAVENOUS | Status: AC | PRN
  Administered 2021-02-07: 15 mL via INTRAVENOUS

## 2021-03-09 ENCOUNTER — Encounter: Payer: Self-pay | Admitting: Neurology

## 2021-03-09 ENCOUNTER — Other Ambulatory Visit: Payer: Self-pay

## 2021-03-09 ENCOUNTER — Ambulatory Visit: Payer: 59 | Admitting: Neurology

## 2021-03-09 VITALS — BP 145/86 | HR 82 | Ht 68.0 in | Wt 168.0 lb

## 2021-03-09 DIAGNOSIS — M48061 Spinal stenosis, lumbar region without neurogenic claudication: Secondary | ICD-10-CM

## 2021-03-09 DIAGNOSIS — G629 Polyneuropathy, unspecified: Secondary | ICD-10-CM | POA: Diagnosis not present
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
## 2021-03-09 NOTE — Patient Instructions (Addendum)
Nerve testing of the legs  MRI lumbar spine wo contrast   Check your feet daily  ELECTROMYOGRAM AND NERVE CONDUCTION STUDIES (EMG/NCS) INSTRUCTIONS  How to Prepare The neurologist conducting the EMG will need to know if you have certain medical conditions. Tell the neurologist and other EMG lab personnel if you: Have a pacemaker or any other electrical medical device Take blood-thinning medications Have hemophilia, a blood-clotting disorder that causes prolonged bleeding Bathing Take a shower or bath shortly before your exam in order to remove oils from your skin. Dont apply lotions or creams before the exam.  What to Expect Youll likely be asked to change into a hospital gown for the procedure and lie down on an examination table. The following explanations can help you understand what will happen during the exam.  Electrodes. The neurologist or a technician places surface electrodes at various locations on your skin depending on where youre experiencing symptoms. Or the neurologist may insert needle electrodes at different sites depending on your symptoms.  Sensations. The electrodes will at times transmit a tiny electrical current that you may feel as a twinge or spasm. The needle electrode may cause discomfort or pain that usually ends shortly after the needle is removed. If you are concerned about discomfort or pain, you may want to talk to the neurologist about taking a short break during the exam.  Instructions. During the needle EMG, the neurologist will assess whether there is any spontaneous electrical activity when the muscle is at rest - activity that isnt present in healthy muscle tissue - and the degree of activity when you slightly contract the muscle.  He or she will give you instructions on resting and contracting a muscle at appropriate times. Depending on what muscles and nerves the neurologist is examining, he or she may ask you to change positions during the exam.   After your EMG You may experience some temporary, minor bruising where the needle electrode was inserted into your muscle. This bruising should fade within several days. If it persists, contact your primary care doctor.

## 2021-04-09 ENCOUNTER — Ambulatory Visit: Payer: 59 | Admitting: Neurology

## 2021-04-09 ENCOUNTER — Other Ambulatory Visit: Payer: Self-pay

## 2021-04-09 ENCOUNTER — Other Ambulatory Visit (INDEPENDENT_AMBULATORY_CARE_PROVIDER_SITE_OTHER): Payer: 59

## 2021-04-09 DIAGNOSIS — G629 Polyneuropathy, unspecified: Secondary | ICD-10-CM

## 2021-04-09 DIAGNOSIS — M48061 Spinal stenosis, lumbar region without neurogenic claudication: Secondary | ICD-10-CM

## 2021-04-09 LAB — C-REACTIVE PROTEIN: CRP: <1 mg/dL (ref 0.5–20.0)

## 2021-04-09 LAB — SEDIMENTATION RATE: Sed Rate: 26 mm/hr — ABNORMAL HIGH (ref 0–20)

## 2021-04-09 LAB — B12 AND FOLATE PANEL
Folate: 10.3 ng/mL (ref >5.9)
Vitamin B-12: 198 pg/mL — ABNORMAL LOW (ref 211–911)

## 2021-04-09 NOTE — Procedures (Signed)
[facility]  ?[address], [address] ? [place], [place] [address] ?Tel: [phone] ?Fax:  [phone] ?Test Date:  04/09/2021 ? ?Patient: Sean Robertson DOB: 02-13-1945 Physician: Narda Amber, DO  ?Sex: Male Height: 5\' 8"  Ref Phys: Narda Amber, DO  ?ID#: [id number]   Technician:   ? ?Patient Complaints: ?This is a 76 year old man referred for evaluation of distal paresthesias and weakness. ? ?NCV & EMG Findings: ?Extensive electrodiagnostic testing of the right lower extremity and additional studies of the left shows:  ?Bilateral sural and superficial peroneal sensory responses are absent. ?Bilateral peroneal (EDB) and tibial motor responses are absent.  Bilateral peroneal motor responses at the tibialis anterior showed reduced amplitude (R2.3, L1.7 mV). ?Bilateral bilateral tibial H reflex studies are absent. ?Chronic motor axonal loss changes are seen affecting the muscles of the lower extremity involving the gradient pattern, which is worse distally.  Active denervation is seen in the tibialis anterior and gastrocnemius muscles. ? ?Impression: ?The electrophysiologic findings are consistent with a active and chronic sensorimotor axonal polyneuropathy affecting the lower extremities.  Overall, these findings are severe in degree electrically. ? ? ?___________________________ ?Narda Amber, DO ? ? ? ?Nerve Conduction Studies ?Anti Sensory Summary Table ? ? Stim Site NR Peak (ms) Norm Peak (ms) P-T Amp (?V) Norm P-T Amp  ?Left Sup Peroneal Anti Sensory (Ant Lat Mall)  32?C  ?12 cm NR  <4.6  >3  ?Right Sup Peroneal Anti Sensory (Ant Lat Mall)  32?C  ?12 cm NR  <4.6  >3  ?Left Sural Anti Sensory (Lat Mall)  32?C  ?Calf NR  <4.6  >3  ?Right Sural Anti Sensory (Lat Mall)  32?C  ?Calf NR  <4.6  >3  ? ?Motor Summary Table ? ? Stim Site NR Onset (ms) Norm Onset (ms) O-P Amp (mV) Norm O-P Amp Site1 Site2 Delta-0 (ms) Dist (cm) Vel (m/s) Norm Vel (m/s)  ?Left Peroneal Motor (Ext Dig Brev)  32?C  ?Ankle NR   <6.0  >2.5 B Fib Ankle  0.0  >40  ?B Fib NR     Poplt B Fib  0.0  >40  ?Poplt NR            ?Right Peroneal Motor (Ext Dig Brev)  32?C  ?Ankle NR  <6.0  >2.5 B Fib Ankle  0.0  >40  ?B Fib NR     Poplt B Fib  0.0  >40  ?Poplt NR            ?Left Peroneal TA Motor (Tib Ant)  32?C  ?Fib Head    3.3 <4.5 1.7 >3 Poplit Fib Head 1.5 8.0 53 >40  ?Poplit    4.8  1.3         ?Right Peroneal TA Motor (Tib Ant)  32?C  ?Fib Head    4.5 <4.5 2.3 >3 Poplit Fib Head 1.8 8.0 44 >40  ?Poplit    6.3  2.2         ?Left Tibial Motor (Abd [place] Crane Brev)  32?C  ?Ankle NR  <6.0  >4 Knee Ankle  0.0  >40  ?Knee NR            ?Right Tibial Motor (Abd [place] Crane Brev)  32?C  ?Ankle NR  <6.0  >4 Knee Ankle  0.0  >40  ?Knee NR            ? ?H Reflex Studies ? ? NR H-Lat (ms) Lat Norm (ms) L-R H-Lat (ms)  ?Left  Tibial (Gastroc)  32?C  ?NR  <35   ?Right Tibial (Gastroc)  32?C  ?NR  <35   ? ?EMG ? ? Side Muscle Ins Act Fibs Psw Fasc Number Recrt Dur Dur. Amp Amp. Poly Poly. Comment  ?Right AntTibialis Nml 1+ Nml Nml 3- Rapid All 1+ All 1+ All 1+ ATR  ?Right Gastroc Nml 1+ Nml Nml SMU Rapid All 1+ All 1+ All 1+ ATR  ?Right Flex Dig Long Nml Nml Nml Nml SMU Rapid All 1+ Most 1+ All 1+ ATR  ?Right RectFemoris Nml Nml Nml Nml 1- Rapid Some 1+ Some 1+ Some 1+ N/A  ?Right GluteusMed Nml Nml Nml Nml Nml Nml Nml Nml Nml Nml Nml Nml N/A  ?Right Lumbo Parasp Low Nml Nml Nml Nml Nml Nml Nml Nml Nml Nml Nml Nml N/A  ?Left BicepsFemS Nml Nml Nml Nml 1- Rapid Some 1+ Some 1+ Some 1+ N/A  ?Right BicepsFemS Nml Nml Nml Nml 2- Rapid Many 1+ Many 1+ Many 1+ N/A  ?Left AntTibialis Nml 2+ Nml Nml SMU Rapid All 1+ All 1+ All 1+ ATR  ?Left Gastroc Nml 2+ Nml Nml SMU Rapid All 1+ All 1+ All 1+ ATR  ?Left Flex Dig Long Nml Nml Nml Nml SMU Rapid All 1+ All 1+ All 1+ ATR  ?Left RectFemoris Nml Nml Nml Nml 1- Rapid Some 1+ Some 1+ Some 1+ N/A  ?Left GluteusMed Nml Nml Nml Nml Nml Nml Nml Nml Nml Nml Nml Nml N/A  ? ? ? ? ?Waveforms: ?    ? ?    ? ?    ? ?    ? ? ?

## 2021-04-16 LAB — PROTEIN ELECTROPHORESIS, SERUM
Albumin ELP: 4.4 g/dL (ref 3.8–4.8)
Alpha 1: 0.3 g/dL (ref 0.2–0.3)
Alpha 2: 0.8 g/dL (ref 0.5–0.9)
Beta 2: 0.4 g/dL (ref 0.2–0.5)
Beta Globulin: 0.4 g/dL (ref 0.4–0.6)
Gamma Globulin: 1.1 g/dL (ref 0.8–1.7)
Total Protein: 7.4 g/dL (ref 6.1–8.1)

## 2021-04-16 LAB — IMMUNOFIXATION ELECTROPHORESIS
IgG (Immunoglobin G), Serum: 1251 mg/dL (ref 600–1540)
IgM, Serum: 48 mg/dL — ABNORMAL LOW (ref 50–300)
Immunofix Electr Int: NOT DETECTED
Immunoglobulin A: 212 mg/dL (ref 70–320)

## 2021-04-16 LAB — COPPER, SERUM: Copper: 94 ug/dL (ref 70–175)

## 2021-04-20 ENCOUNTER — Ambulatory Visit: Payer: 59 | Admitting: Neurology

## 2021-04-20 ENCOUNTER — Other Ambulatory Visit: Payer: Self-pay

## 2021-04-20 ENCOUNTER — Encounter: Payer: Self-pay | Admitting: Neurology

## 2021-04-20 VITALS — BP 131/75 | HR 73 | Ht 68.0 in | Wt 172.0 lb

## 2021-04-20 DIAGNOSIS — G629 Polyneuropathy, unspecified: Secondary | ICD-10-CM | POA: Diagnosis not present

## 2021-04-20 MED ORDER — CYANOCOBALAMIN 1000 MCG/ML IJ SOLN
1000.0000 ug | Freq: Once | INTRAMUSCULAR | Status: AC
Start: 2021-04-20 — End: 2021-04-20
  Administered 2021-04-20: 1000 ug via INTRAMUSCULAR

## 2021-04-20 NOTE — Patient Instructions (Signed)
Start B12 injections ? ?We will order lumbar puncture.  Please schedule after you have done your MRI. ? ? ? ? ?

## 2021-04-20 NOTE — Progress Notes (Unsigned)
Follow-up Visit   Date: 04/20/21   Sean Robertson MRN: [id number] DOB: Sep 06, 1945   Interim History: Sean Robertson is a 76 y.o. right-handed Caucasian male with right-handed male with COPD, well-controlled diabetes mellitus, hyperlipidemia, prostate caner and GERD returning to the clinic for follow-up of neuropathy.  The patient was accompanied to the clinic by self.  History of present illness: He has been diabetic since 2014 and starting around 2015, he began having tingling and pressure-like sensation involving the feet. Over the years, it has progressed up to the level of his knees, worse in the feet.  Symptoms are not as apparent when he is wearing shoes. He denies pain, more of discomfort. Balance is fair, he has not had any falls.  He walks unassisted. He also complains of weakness in the thighs and lower legs, which is worse with exertion, such as walking up a hill or climbing stairs. No back pain.  Prior MRI lumbar spine from 2015 shows central disc protrusion affecting the left L1 or L2 nerve roots, no canal stenosis. CKs were mildly elevated last year and trial off statin medication improved CK, but not his weakness. He was evaluated by Dr. Trudie Reed at [facility] who did not find any primary autoimmune pathology to explain his symptoms.   He has some neck pain. No weakness or tingling in the arms.    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 Nonsmoker. Does not drink alcohol.   UPDATE 04/20/2021:  He is here for follow-up visit to discuss the results of EMG and labs.   Medications:  Current Outpatient Medications on File Prior to Visit  Medication Sig Dispense Refill   linagliptin (TRADJENTA) 5 MG TABS tablet Take 5 mg by mouth daily.     pantoprazole (PROTONIX) 40 MG tablet Take 40 mg by mouth 2 (two) times daily.     sildenafil (REVATIO) 20 MG tablet Take 20 mg by mouth as needed.     tamsulosin (FLOMAX) 0.4 MG CAPS capsule Take 0.4 mg by mouth daily.      triamterene-hydrochlorothiazide (MAXZIDE-25) 37.5-25 MG tablet Take 1 tablet by mouth every morning.     No current facility-administered medications on file prior to visit.    Allergies:  Allergies  Allergen Reactions   Cialis [Tadalafil] Other (See Comments)    Back pain   Gemfibrozil Other (See Comments)    Muscle weakness   Lovastatin Other (See Comments)    Rash, myalgias   Zocor [Simvastatin] Other (See Comments)    myalgias   Shellfish Allergy Hives    Only shrimp    Vital Signs:  BP 131/75    Pulse 73    Ht $R'5\' 8"'Qi$  (1.727 m)    Wt 172 lb (78 kg)    SpO2 97%    BMI 26.15 kg/m    Neurological Exam: MENTAL STATUS including orientation to time, place, person, recent and remote memory, attention span and concentration, language, and fund of knowledge is ***normal.  Speech is not dysarthric.  CRANIAL NERVES:  No visual field defects.  Pupils equal round and reactive to light.  Normal conjugate, extra-ocular eye movements in all directions of gaze.  No ptosis ***.  Face is symmetric. Palate elevates symmetrically.  Tongue is midline.  MOTOR:  Motor strength is 5/5 in all extremities, ***.  No atrophy, fasciculations or abnormal movements.  No pronator drift.  Tone is normal.    MSRs:  Reflexes are 2+/4 throughout ***.  SENSORY:  Intact to vibration throughout ***.  COORDINATION/GAIT:  Normal finger-to- nose-finger.  Intact rapid alternating movements bilaterally.  Gait narrow based and stable.   Data: NCS/EMG of the lower extremities 04/09/2021: The electrophysiologic findings are consistent with a active and chronic sensorimotor axonal polyneuropathy affecting the lower extremities.  Overall, these findings are severe in degree electrically.  Labs 04/09/2021:  ESR 26, CRP <1.0, vitmain B12 198*, folate 10.3, copper 94, SPEP with IFE no M protein   IMPRESSION/PLAN: ***  PLAN/RECOMMENDATIONS:  ***  Return to clinic in ***.   Total time spent:  ***  Thank you for  allowing me to participate in patient's care.  If I can answer any additional questions, I would be pleased to do so.    Sincerely,    Orlondo Holycross K. Posey Pronto, DO

## 2021-04-21 ENCOUNTER — Telehealth: Payer: Self-pay

## 2021-04-21 ENCOUNTER — Telehealth: Payer: Self-pay | Admitting: Neurology

## 2021-04-21 ENCOUNTER — Ambulatory Visit (INDEPENDENT_AMBULATORY_CARE_PROVIDER_SITE_OTHER): Payer: 59

## 2021-04-21 DIAGNOSIS — E538 Deficiency of other specified B group vitamins: Secondary | ICD-10-CM | POA: Diagnosis not present

## 2021-04-21 MED ORDER — CYANOCOBALAMIN 1000 MCG/ML IJ SOLN
1000.0000 ug | Freq: Once | INTRAMUSCULAR | Status: AC
  Administered 2021-04-21: 1000 ug via INTRAMUSCULAR

## 2021-04-21 NOTE — Telephone Encounter (Signed)
-----   Message from Alda Berthold, DO sent at 04/20/2021 12:50 PM EDT ----- ?If we can get at least 7-days + 4 weeks of injections, we can move to oral afterwards. ? ?----- Message ----- ?From: Nira Conn, CMA ?Sent: 04/20/2021  12:25 PM EDT ?To: Alda Berthold, DO ? ?Dr. Posey Pronto- ?Patient wanted to ask if he could take B12 orally at some point? He said he will come for a few shots but with his work schedule he doesn't know if he can do all the shots required. He is planning ot come back tomorrow for his next shot. He just wanted to know if he could do oral which would be easier. I told him I would ask you and call him.  ? ?Thank you, ?Exavier Lina  ? ? ?

## 2021-04-21 NOTE — Telephone Encounter (Signed)
Office notes have been faxed to referring physician as requested. ?

## 2021-04-21 NOTE — Telephone Encounter (Signed)
Patient's PCP called and left a voice mail requesting a copy of the visit notes from 04/20/21 to be faxed to: [phone]. ?

## 2021-04-21 NOTE — Telephone Encounter (Signed)
Called patient and informed him that per Dr. Posey Pronto If we can get at least 7-days + 4 weeks of injections, we can move to oral afterwards. Patient verbalized understanding and had no further questions or concerns.  ?

## 2021-04-22 ENCOUNTER — Telehealth: Payer: Self-pay | Admitting: Neurology

## 2021-04-22 ENCOUNTER — Ambulatory Visit (INDEPENDENT_AMBULATORY_CARE_PROVIDER_SITE_OTHER): Payer: 59

## 2021-04-22 ENCOUNTER — Other Ambulatory Visit: Payer: Self-pay

## 2021-04-22 DIAGNOSIS — E538 Deficiency of other specified B group vitamins: Secondary | ICD-10-CM

## 2021-04-22 DIAGNOSIS — G610 Guillain-Barre syndrome: Secondary | ICD-10-CM

## 2021-04-22 MED ORDER — CYANOCOBALAMIN 1000 MCG/ML IJ SOLN
1000.0000 ug | Freq: Once | INTRAMUSCULAR | Status: AC
  Administered 2021-04-22: 1000 ug via INTRAMUSCULAR

## 2021-04-22 NOTE — Telephone Encounter (Signed)
Notes have been refaxed as requested.  ?

## 2021-04-22 NOTE — Telephone Encounter (Signed)
The office notes that were faxed to his referring physician were not received.  They gave an alternate fax number. ?[phone] ?

## 2021-04-23 ENCOUNTER — Ambulatory Visit (INDEPENDENT_AMBULATORY_CARE_PROVIDER_SITE_OTHER): Payer: 59

## 2021-04-23 DIAGNOSIS — E538 Deficiency of other specified B group vitamins: Secondary | ICD-10-CM | POA: Diagnosis not present

## 2021-04-23 MED ORDER — CYANOCOBALAMIN 1000 MCG/ML IJ SOLN
1000.0000 ug | Freq: Once | INTRAMUSCULAR | Status: AC
  Administered 2021-04-23: 1000 ug via INTRAMUSCULAR

## 2021-04-24 ENCOUNTER — Other Ambulatory Visit: Payer: Self-pay

## 2021-04-24 ENCOUNTER — Ambulatory Visit (INDEPENDENT_AMBULATORY_CARE_PROVIDER_SITE_OTHER): Payer: 59

## 2021-04-24 DIAGNOSIS — E538 Deficiency of other specified B group vitamins: Secondary | ICD-10-CM | POA: Diagnosis not present

## 2021-04-24 MED ORDER — CYANOCOBALAMIN 1000 MCG/ML IJ SOLN
1000.0000 ug | Freq: Once | INTRAMUSCULAR | Status: AC
  Administered 2021-04-24: 1000 ug via INTRAMUSCULAR

## 2021-04-27 ENCOUNTER — Other Ambulatory Visit: Payer: Self-pay

## 2021-04-27 ENCOUNTER — Ambulatory Visit (INDEPENDENT_AMBULATORY_CARE_PROVIDER_SITE_OTHER): Payer: 59

## 2021-04-27 ENCOUNTER — Ambulatory Visit: Payer: 59

## 2021-04-27 DIAGNOSIS — E538 Deficiency of other specified B group vitamins: Secondary | ICD-10-CM | POA: Diagnosis not present

## 2021-04-27 MED ORDER — CYANOCOBALAMIN 1000 MCG/ML IJ SOLN
1000.0000 ug | Freq: Once | INTRAMUSCULAR | Status: AC
  Administered 2021-04-27: 1000 ug via INTRAMUSCULAR

## 2021-04-28 ENCOUNTER — Ambulatory Visit (INDEPENDENT_AMBULATORY_CARE_PROVIDER_SITE_OTHER): Payer: 59

## 2021-04-28 DIAGNOSIS — E538 Deficiency of other specified B group vitamins: Secondary | ICD-10-CM

## 2021-04-28 MED ORDER — CYANOCOBALAMIN 1000 MCG/ML IJ SOLN
1000.0000 ug | Freq: Once | INTRAMUSCULAR | Status: AC
  Administered 2021-04-28: 1000 ug via INTRAMUSCULAR

## 2021-05-11 ENCOUNTER — Ambulatory Visit
Admission: RE | Admit: 2021-05-11 | Discharge: 2021-05-11 | Disposition: A | Discharge status: Home / Self Care | Payer: 59 | Source: Ambulatory Visit | Attending: Neurology | Admitting: Neurology

## 2021-05-11 ENCOUNTER — Other Ambulatory Visit: Payer: Self-pay | Admitting: Neurology

## 2021-05-11 VITALS — BP 167/82 | HR 62

## 2021-05-11 DIAGNOSIS — G610 Guillain-Barre syndrome: Secondary | ICD-10-CM

## 2021-05-11 NOTE — Progress Notes (Signed)
Blood drawn from pts RAC to be sent off with LP lab work. 1 successful attempt. Pt tolerated well. Gauze and tape applied after.  

## 2021-05-11 NOTE — Discharge Instructions (Signed)

## 2021-05-14 ENCOUNTER — Other Ambulatory Visit: Payer: Self-pay

## 2021-05-14 ENCOUNTER — Telehealth: Payer: Self-pay | Admitting: Neurology

## 2021-05-14 DIAGNOSIS — G971 Other reaction to spinal and lumbar puncture: Secondary | ICD-10-CM

## 2021-05-14 MED ORDER — BUTALBITAL-APAP-CAFFEINE 50-325-40 MG PO TABS: 1.0000 | ORAL_TABLET | Freq: Four times a day (QID) | ORAL | 0 refills | Status: DC | PRN

## 2021-05-14 NOTE — Telephone Encounter (Signed)
Received a call from [facility] that patient walk-in with complains of severe headache and wanted to know if they should schedule him for blood patch or try other therapy.   ? ?Patient was provided the option to try fioricet for headache for the next 2-3 days or go ahead with blood patch today.  He prefers trying medication and will contact radiology, if no better by Monday to schedule blood patch.  Rx will be sent. ?

## 2021-05-15 ENCOUNTER — Ambulatory Visit
Admission: RE | Admit: 2021-05-15 | Discharge: 2021-05-15 | Disposition: A | Discharge status: Home / Self Care | Payer: 59 | Source: Ambulatory Visit | Attending: Neurology | Admitting: Neurology

## 2021-05-15 DIAGNOSIS — G971 Other reaction to spinal and lumbar puncture: Secondary | ICD-10-CM

## 2021-05-15 NOTE — Progress Notes (Signed)
20cc of Blood drawn from pts RAC for blood patch procedure. 1 successful attempt, pt tolerated well. Gauze and tape applied after.  ?

## 2021-05-15 NOTE — Discharge Instructions (Signed)

## 2021-05-19 LAB — CSF CELL COUNT WITH DIFFERENTIAL
RBC Count, CSF: 0 cells/uL
WBC, CSF: 0 cells/uL (ref 0–5)

## 2021-05-19 LAB — PROTEIN, CSF: Total Protein, CSF: 51 mg/dL (ref 15–60)

## 2021-05-19 LAB — OLIGOCLONAL BANDS, CSF + SERM

## 2021-05-19 LAB — GLUCOSE, CSF: Glucose, CSF: 65 mg/dL (ref 40–80)

## 2021-05-19 LAB — ANGIOTENSIN CONVERTING ENZYME, CSF: ANGIOTENSIN CONVERTING ENZYME ( ACE) CSF: 7 U/L (ref <=15)

## 2021-05-29 ENCOUNTER — Ambulatory Visit (INDEPENDENT_AMBULATORY_CARE_PROVIDER_SITE_OTHER): Payer: 59

## 2021-05-29 ENCOUNTER — Ambulatory Visit: Payer: 59

## 2021-05-29 DIAGNOSIS — E538 Deficiency of other specified B group vitamins: Secondary | ICD-10-CM | POA: Diagnosis not present

## 2021-05-29 MED ORDER — CYANOCOBALAMIN 1000 MCG/ML IJ SOLN
1000.0000 ug | Freq: Once | INTRAMUSCULAR | Status: AC
  Administered 2021-05-29: 1000 ug via INTRAMUSCULAR

## 2021-06-29 ENCOUNTER — Ambulatory Visit: Payer: 59

## 2021-07-20 ENCOUNTER — Telehealth: Payer: Self-pay | Admitting: Neurology

## 2021-07-20 DIAGNOSIS — E538 Deficiency of other specified B group vitamins: Secondary | ICD-10-CM

## 2021-07-20 NOTE — Telephone Encounter (Signed)
Pt called in wanting to get back on the schedule to get his B12 shots. He had to stop coming due to his work schedule.

## 2021-07-21 NOTE — Telephone Encounter (Signed)
Patient advised of Dr.Aqunio note below.  B12 ordered and patient will come by the lab tomorrow morning.

## 2021-07-21 NOTE — Telephone Encounter (Signed)
Dr. Posey Pronto had instructed in March to do injection daily x 7 days, weekly x 4 weeks, then monthly thereafter x 1 year. He only got the 7 days of injections in March. Would re-check B12 level first, thanks

## 2021-07-22 ENCOUNTER — Other Ambulatory Visit: Payer: 59

## 2021-07-22 LAB — VITAMIN B12: Vitamin B-12: 434 pg/mL (ref 211–911)

## 2021-07-29 NOTE — Telephone Encounter (Signed)
Sent out letter to inform Pt. To call about B12 shoots

## 2021-08-03 ENCOUNTER — Ambulatory Visit (INDEPENDENT_AMBULATORY_CARE_PROVIDER_SITE_OTHER): Payer: 59

## 2021-08-03 DIAGNOSIS — G629 Polyneuropathy, unspecified: Secondary | ICD-10-CM | POA: Diagnosis not present

## 2021-08-03 MED ORDER — CYANOCOBALAMIN 1000 MCG/ML IJ SOLN
1000.0000 ug | Freq: Once | INTRAMUSCULAR | Status: AC
  Administered 2021-08-03: 1000 ug via INTRAMUSCULAR

## 2022-01-25 ENCOUNTER — Other Ambulatory Visit: Payer: Self-pay | Admitting: Family Medicine

## 2022-01-25 ENCOUNTER — Ambulatory Visit
Admission: RE | Admit: 2022-01-25 | Discharge: 2022-01-25 | Disposition: A | Discharge status: Home / Self Care | Payer: 59 | Source: Ambulatory Visit | Attending: Family Medicine | Admitting: Family Medicine

## 2022-01-25 DIAGNOSIS — R0989 Other specified symptoms and signs involving the circulatory and respiratory systems: Secondary | ICD-10-CM

## 2022-01-25 DIAGNOSIS — R059 Cough, unspecified: Secondary | ICD-10-CM

## 2022-03-30 NOTE — Progress Notes (Unsigned)
Sean Robertson, male    DOB: 1945-11-17   MRN: [id number]   Brief patient profile:  53  yowm quit smoking 1988  referred to pulmonary clinic 03/31/2022 by Dr Derinda Late for new onset exertional fatigue > sob         History of Present Illness  03/31/2022  Pulmonary/ 1st office eval/Madisson Kulaga maint on advair/ albuterol with good technique  Chief Complaint  Patient presents with   Pulmonary Consult    Referred by Dr Sandi Mariscal. Pt c/o cough with white sputum and sensation of congestion and pressure in his chest- started when he had the flu Dec 2023. He is using his albuterol 4 x daily scheduled.   Dyspnea:  > 50 % improved vs worse days with flu and at time of ov  mostly just tired esp legs with exertion and not really limited by sob  - says sbp runs in 170-180  since flu vs 140 normal / pulse running in high 30s or 40's vs 60 normally and not on beta blockers or CCB Cough: gone / chest pressure is also gone even with exertion  Sleep: no resp cc flat bed  one pillow  SABA use: 4 x daily (not prn) plus advari 250 bid   [place] walking 30 min p lunch at [employer] = baseline prior to flu   No obvious day to day or daytime pattern/variability or assoc excess/ purulent sputum or mucus plugs or hemoptysis or cp or chest tightness, subjective wheeze or overt sinus or hb symptoms.   Sleeping  without nocturnal  or early am exacerbation  of respiratory  c/o's or need for noct saba. Also denies any obvious fluctuation of symptoms with weather or environmental changes or other aggravating or alleviating factors except as outlined above   No unusual exposure hx or h/o childhood pna/ asthma or knowledge of premature birth.  Current Allergies, Complete Past Medical History, Past Surgical History, Family History, and Social History were reviewed in [employer] record.  ROS  The following are not active complaints unless bolded Hoarseness, sore throat, dysphagia, dental problems,  itching, sneezing,  nasal congestion or discharge of excess mucus or purulent secretions, ear ache,   fever, chills, sweats, unintended wt loss or wt gain, classically pleuritic or exertional cp,  orthopnea pnd or arm/hand swelling  or leg swelling, presyncope, palpitations, abdominal pain, anorexia, nausea, vomiting, diarrhea  or change in bowel habits or change in bladder habits, change in stools or change in urine, dysuria, hematuria,  rash, arthralgias, visual complaints, headache, numbness, weakness or ataxia or problems with walking or coordination,  change in mood or  memory.           Past Medical History:  Diagnosis Date   Actinic keratoses 2012   of face   Adenomatous polyp    BPH (benign prostatic hyperplasia)    Cervical stenosis of spine    predominantly C3-C6   COPD (chronic obstructive pulmonary disease) (HCC)    Diabetes mellitus without complication ([place])    Diverticulosis    ED (erectile dysfunction)    GERD (gastroesophageal reflux disease)    Helicobacter pylori gastritis 07/24/2020   Found at EGD     HSV-1 (herpes simplex virus 1) infection    on left side of nose   HSV-2 (herpes simplex virus 2) infection    Hx of adenomatous colonic polyps 2005   Hyperlipidemia    Peripheral neuropathy    Prostate cancer ([place])  Prostatitis    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     atorvastatin (LIPITOR) 40 MG tablet Take 40 mg by mouth daily.     ezetimibe (ZETIA) 10 MG tablet Take 10 mg by mouth daily.     fluticasone-salmeterol (ADVAIR) 250-50 MCG/ACT AEPB Inhale 1 puff into the lungs in the morning and at bedtime.     linagliptin (TRADJENTA) 5 MG TABS tablet Take 5 mg by mouth daily.     losartan (COZAAR) 50 MG  tablet Take 50 mg by mouth daily.     pantoprazole (PROTONIX) 40 MG tablet Take 40 mg by mouth 2 (two) times daily.     tamsulosin (FLOMAX) 0.4 MG CAPS capsule Take 0.4 mg by mouth daily.     butalbital-acetaminophen-caffeine (FIORICET) 50-325-40 MG tablet Take 1 tablet by mouth every 6 (six) hours as needed for headache. 12 tablet 0   sildenafil (REVATIO) 20 MG tablet Take 20 mg by mouth as needed.     triamterene-hydrochlorothiazide (MAXZIDE-25) 37.5-25 MG tablet Take 1 tablet by mouth every morning.     No facility-administered medications prior to visit.     Objective:     BP (!) 180/60 (BP Location: Left Arm, Cuff Size: Normal)   Pulse (!) 38   Temp 98 F (36.7 C) (Oral)   Ht 5' 8"$  (1.727 m)   Wt 170 lb (77.1 kg)   SpO2 98% Comment: on RA  BMI 25.85 kg/m   SpO2: 98 % (on RA)  Robust energetic appearing amb wm nad    HEENT : Oropharynx  clear     NECK :  without  apparent JVD/ palpable Nodes/TM    LUNGS: no acc muscle use,  Min barrel  contour chest wall with bilateral  slightly decreased bs s audible wheeze and  without cough on insp or exp maneuvers and min  Hyperresonant  to  percussion bilaterally    CV:  RRR  no s3 or murmur or increase in P2, and no edema   ABD:  soft and nontender with pos end  insp Hoover's  in the supine position.  No bruits or organomegaly appreciated   MS:  Nl gait/ ext warm without deformities Or obvious joint restrictions  calf tenderness, cyanosis or clubbing     SKIN: warm and dry without lesions    NEURO:  alert, approp, nl sensorium with  no motor or cerebellar deficits apparent.        CXR PA and Lateral:   03/31/2022 :    I personally reviewed images and impression is as follows:     Mild copd/ no acute findings   EKG  03/31/2022 SB with RBBB no ischemic changes     Assessment   DOE (dyspnea on exertion) Onset with flu like illness mid  Dec 2023  - 03/31/2022   Walked on RA  x  3  lap(s) =  approx 750  ft  @ mod  pace,  stopped due to end of study  with lowest 02 sats 96% and highest HR = 59  s sob or presyncope   He is back near baseline p bout of flu with minimal findings to suggest significant copd so likely most of his  problem with AB related to viral illness and should not need to continue maint rx since did not need any prior to the flu and "doesn't like taking meds' so suggested  1) instructed on approp use of saba 03/31/2022  After extensive coaching inhaler device,  effectiveness =    90% (very good baseline thanks to Dr Sharmaine Base input on approp use of HFA 2) since saba technique so good ok to stop the qid rx and just use q 4 h prn then gradually wean off the advair 250 as follows:  If breathing fine afte a week on just prn saba  and if don't find  need albuterol at all then ok to drop to one click each am x one week and then stop completely after another week  and see if you don't breath just as well and if not restart. 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  Total time for H and P, chart review, counseling, reviewing hfa/dpi device(s) , directly observing portions of ambulatory 02 saturation study/ and generating customized AVS unique to this office visit / same day charting = 45 min with pt new to me                  Christinia Gully, MD 03/31/2022

## 2022-03-31 ENCOUNTER — Ambulatory Visit (INDEPENDENT_AMBULATORY_CARE_PROVIDER_SITE_OTHER): Payer: 59

## 2022-03-31 ENCOUNTER — Ambulatory Visit: Payer: 59 | Admitting: Internal Medicine

## 2022-03-31 ENCOUNTER — Encounter: Payer: Self-pay | Admitting: Internal Medicine

## 2022-03-31 VITALS — BP 180/60 | HR 38 | Temp 98.0°F | Ht 68.0 in | Wt 170.0 lb

## 2022-03-31 DIAGNOSIS — R0609 Other forms of dyspnea: Secondary | ICD-10-CM

## 2022-03-31 DIAGNOSIS — R001 Bradycardia, unspecified: Secondary | ICD-10-CM

## 2022-03-31 DIAGNOSIS — I10 Essential (primary) hypertension: Secondary | ICD-10-CM

## 2022-03-31 NOTE — Patient Instructions (Addendum)
Plan A = Automatic = Always=    fluticasone-salmeterol one twice daily for now    If breathing fine afte a week and don't find you need albuterol then ok to drop ton one click each am and then stop completely and see if you don't breath just as well and if not restart.  Plan B = Backup (to supplement plan A, not to replace it) Only use your albuterol inhaler as a rescue medication to be used if you can't catch your breath by resting or doing a relaxed purse lip breathing pattern.  - The less you use it, the better it will work when you need it. - Ok to use the inhaler up to 2 puffs  every 4 hours if you must but call for appointment if use goes up over your usual need - Don't leave home without it !!  (think of it like starter   Also  Ok to try albuterol 15 min before an activity (on alternating days)  that you know would usually make you short of breath and see if it makes any difference and if makes none then don't take albuterol after activity unless you can't catch your breath as this means it's the resting that helps, not the albuterol.      Please remember to go to the  x-ray department  for your tests - we will call you with the results when they are available     Follow up here is as needed

## 2022-03-31 NOTE — Assessment & Plan Note (Signed)
Onset with flu like illness mid  Dec 2023  - 03/31/2022   Walked on RA  x  3  lap(s) =  approx 750  ft  @ mod  pace, stopped due to end of study  with lowest 02 sats 96% and highest HR = 59  s sob or presyncope   He is back near baseline p bout of flu with minimal findings to suggest significant copd so likely most of his problem with AB related to viral illness and should not need to continue maint rx since did not need any prior to the flu and "doesn't like taking meds' so suggested  1) instructed on approp use of saba 03/31/2022  After extensive coaching inhaler device,  effectiveness =    90% (very good baseline thanks to Dr Sharmaine Base input on approp use of HFA 2) since saba technique so good ok to stop the qid rx and just use q 4 h prn then gradually wean off the advair 250 as follows:  If breathing fine afte a week on just prn saba  and if don't find  need albuterol at all then ok to drop to one click each am x one week and then stop completely after another week  and see if you don't breath just as well and if not restart. If do find need more albuterol option are prn symbicort 80 or air supra in place of albuterol up to 2 puff q 4 h prn flares of AB   Pulmonary f/u can be prn

## 2022-03-31 NOTE — Assessment & Plan Note (Signed)
EKG  03/31/2022 SB with RBBB no ischemic changes  He must have very healthy heart with good stroke volume despite high afterload and a reflex SB but tolerating it just fine - defer tweaking bp meds to Dr Sharmaine Base capable hands   Each maintenance medication was reviewed in detail including emphasizing most importantly the difference between maintenance and prns and under what circumstances the prns are to be triggered using an action plan format where appropriate.  Total time for H and P, chart review, counseling, reviewing hfa/dpi device(s) , directly observing portions of ambulatory 02 saturation study/ and generating customized AVS unique to this office visit / same day charting = 45 min with pt new to me

## 2022-11-15 ENCOUNTER — Other Ambulatory Visit: Payer: Self-pay | Admitting: Family Medicine

## 2022-11-15 ENCOUNTER — Ambulatory Visit
Admission: RE | Admit: 2022-11-15 | Discharge: 2022-11-15 | Disposition: A | Discharge status: Home / Self Care | Payer: 59 | Source: Ambulatory Visit | Attending: Family Medicine | Admitting: Family Medicine

## 2022-11-15 DIAGNOSIS — M25511 Pain in right shoulder: Secondary | ICD-10-CM

## 2023-02-22 ENCOUNTER — Ambulatory Visit
Admission: RE | Admit: 2023-02-22 | Discharge: 2023-02-22 | Disposition: A | Discharge status: Home / Self Care | Payer: 59 | Source: Ambulatory Visit | Attending: Family Medicine | Admitting: Family Medicine

## 2023-02-22 ENCOUNTER — Other Ambulatory Visit: Payer: Self-pay | Admitting: Family Medicine

## 2023-02-22 DIAGNOSIS — M898X6 Other specified disorders of bone, lower leg: Secondary | ICD-10-CM

## 2023-03-23 ENCOUNTER — Ambulatory Visit: Payer: 59 | Attending: Cardiology | Admitting: Cardiology

## 2023-03-23 ENCOUNTER — Encounter: Payer: Self-pay | Admitting: Cardiology

## 2023-03-23 ENCOUNTER — Other Ambulatory Visit: Payer: Self-pay | Admitting: Cardiology

## 2023-03-23 ENCOUNTER — Other Ambulatory Visit (INDEPENDENT_AMBULATORY_CARE_PROVIDER_SITE_OTHER): Payer: 59

## 2023-03-23 VITALS — BP 166/70 | HR 34 | Ht 68.0 in | Wt 179.4 lb

## 2023-03-23 DIAGNOSIS — I444 Left anterior fascicular block: Secondary | ICD-10-CM

## 2023-03-23 DIAGNOSIS — E785 Hyperlipidemia, unspecified: Secondary | ICD-10-CM

## 2023-03-23 DIAGNOSIS — I441 Atrioventricular block, second degree: Secondary | ICD-10-CM

## 2023-03-23 DIAGNOSIS — I4510 Unspecified right bundle-branch block: Secondary | ICD-10-CM

## 2023-03-23 DIAGNOSIS — R0609 Other forms of dyspnea: Secondary | ICD-10-CM

## 2023-03-23 DIAGNOSIS — N1831 Chronic kidney disease, stage 3a: Secondary | ICD-10-CM

## 2023-03-23 DIAGNOSIS — I10 Essential (primary) hypertension: Secondary | ICD-10-CM

## 2023-03-23 DIAGNOSIS — E1169 Type 2 diabetes mellitus with other specified complication: Secondary | ICD-10-CM

## 2023-03-23 DIAGNOSIS — K449 Diaphragmatic hernia without obstruction or gangrene: Secondary | ICD-10-CM

## 2023-03-23 DIAGNOSIS — K219 Gastro-esophageal reflux disease without esophagitis: Secondary | ICD-10-CM

## 2023-03-23 DIAGNOSIS — J42 Unspecified chronic bronchitis: Secondary | ICD-10-CM

## 2023-03-23 NOTE — Patient Instructions (Addendum)
Medication Instructions:   No changes  *If you need a refill on your cardiac medications before your next appointment, please call your pharmacy*   Lab Work:  Not needed     Testing/Procedures: Will be mailed to your home 3 to 7 days Your physician has recommended that you wear a holter monitor  Live Zio . Holter monitors are medical devices that record the heart's electrical activity. Doctors most often use these monitors to diagnose arrhythmias. Arrhythmias are problems with the speed or rhythm of the heartbeat. The monitor is a small, portable device. You can wear one while you do your normal daily activities. This is usually used to diagnose what is causing palpitations/syncope (passing out).    Follow-Up: At [facility], you and your health needs are our priority.  As part of our continuing mission to provide you with exceptional heart care, we have created designated Provider Care Teams.  These Care Teams include your primary Cardiologist (physician) and Advanced Practice Providers (APPs -  Physician Assistants and Nurse Practitioners) who all work together to provide you with the care you need, when you need it.  We recommend signing up for the patient portal called "MyChart".  Sign up information is provided on this After Visit Summary.  MyChart is used to connect with patients for Virtual Visits (Telemedicine).  Patients are able to view lab/test results, encounter notes, upcoming appointments, etc.  Non-urgent messages can be sent to your provider as well.   To learn more about what you can do with MyChart, go to [email or web address].    Your next appointment:   4 month(s)  The format for your next appointment:   In Person  Provider:   Bryan Lemma, MD    Other Instructions  You have been referred to  Electrophysiologist at [facility] [address] - discuss monitor result and  slow heart rate      ZIO AT Long term monitor-Live Telemetry  Your  physician has requested you wear a ZIO patch monitor for 7 days.  This is a single patch monitor. [employer] supplies one patch monitor per enrollment. Additional  stickers are not available.  Please do not apply patch if you will be having a Nuclear Stress Test, Echocardiogram, Cardiac CT, MRI,  or Chest Xray during the period you would be wearing the monitor. The patch cannot be worn during  these tests. You cannot remove and re-apply the ZIO AT patch monitor.  Your ZIO patch monitor will be mailed 3 day USPS to your address on file. It may take 3-5 days to  receive your monitor after you have been enrolled.  Once you have received your monitor, please review the enclosed instructions. Your monitor has  already been registered assigning a specific monitor serial # to you.   Billing and Patient Assistance Program information  Meredeth Ide has been supplied with any insurance information on record for billing. [employer] offers a sliding scale Patient Assistance Program for patients without insurance, or whose  insurance does not completely cover the cost of the ZIO patch monitor. You must apply for the  Patient Assistance Program to qualify for the discounted rate. To apply, call [employer] at [phone],  select option 4, select option 2 , ask to apply for the Patient Assistance Program, (you can request an  interpreter if needed). [employer] will ask your household income and how many people are in your  household. [employer] will quote your out-of-pocket cost based on this information. They will also  be able  to set up a 12 month interest free payment plan if needed.  Applying the monitor   Shave hair from upper left chest.  Hold the abrader disc by orange tab. Rub the abrader in 40 strokes over left upper chest as indicated in  your monitor instructions.  Clean area with 4 enclosed alcohol pads. Use all pads to ensure the area is cleaned thoroughly. Let  dry.  Apply patch as indicated in monitor  instructions. Patch will be placed under collarbone on left side of  chest with arrow pointing upward.  Rub patch adhesive wings for 2 minutes. Remove the white label marked "1". Remove the white label  marked "2". Rub patch adhesive wings for 2 additional minutes.  While looking in a mirror, press and release button in center of patch. A small green light will flash 3-4  times. This will be your only indicator that the monitor has been turned on.  Do not shower for the first 24 hours. You may shower after the first 24 hours.  Press the button if you feel a symptom. You will hear a small click. Record Date, Time and Symptom in  the Patient Log.   Starting the Gateway  In your kit there is a Audiological scientist box the size of a cellphone. This is Buyer, retail. It transmits all your  recorded data to [facility]. This box must always stay within 10 feet of you. Open the box and push the *  button. There will be a light that blinks orange and then green a few times. When the light stops  blinking, the Gateway is connected to the ZIO patch. Call [employer] at [phone] to confirm your monitor is transmitting.  Returning your monitor  Remove your patch and place it inside the Gateway. In the lower half of the Gateway there is a white  bag with prepaid postage on it. Place Gateway in bag and seal. Mail package back to [place] as soon as  possible. Your physician should have your final report approximately 7 days after you have mailed back  your monitor. Call [facility] Customer Care at [phone] if you have questions regarding your ZIO AT  patch monitor. Call them immediately if you see an orange light blinking on your monitor.  If your monitor falls off in less than 4 days, contact our Monitor department at [phone]. If your  monitor becomes loose or falls off after 4 days call [employer] at [phone] for suggestions on  securing your monitor
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
## 2023-03-23 NOTE — Progress Notes (Unsigned)
Enrolled for [employer] to mail a ZIO AT Live Telemetry monitor to patients address on file.

## 2023-03-27 ENCOUNTER — Encounter: Payer: Self-pay | Admitting: Cardiology

## 2023-03-27 DIAGNOSIS — E1169 Type 2 diabetes mellitus with other specified complication: Secondary | ICD-10-CM | POA: Insufficient documentation

## 2023-03-27 DIAGNOSIS — K219 Gastro-esophageal reflux disease without esophagitis: Secondary | ICD-10-CM | POA: Insufficient documentation

## 2023-03-27 DIAGNOSIS — N1831 Chronic kidney disease, stage 3a: Secondary | ICD-10-CM | POA: Insufficient documentation

## 2023-03-27 DIAGNOSIS — J449 Chronic obstructive pulmonary disease, unspecified: Secondary | ICD-10-CM | POA: Insufficient documentation

## 2023-03-27 NOTE — Assessment & Plan Note (Signed)
Also suggestive of conduction disease. Checking 2D echo to exclude regional wall motion normalities from potential ischemic etiology

## 2023-03-27 NOTE — Assessment & Plan Note (Signed)
Blood pressure consistently around 140-150 at home.  Currently on Losartan 50mg  and HCTZ 12.5mg  daily. -Continue current antihypertensive regimen, with low threshold to further titrate medicines. -Clearly no AV nodal agents to avoid further bradycardia

## 2023-03-27 NOTE — Assessment & Plan Note (Signed)
His EKGs have shown sinus rhythm with 2-1 block, cannot tell if this is Mobitz type I or Mobitz type II based on the fact this 2-1 block however, he also has underlying LAFB and RBBB which would suggest he does have potential for high-grade AV block. No syncope, dizziness, or chest pain. Fatigue and leg weakness with exertion. 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 -Order 1-week Zio monitor to assess heart rate variability and presence of high-grade block.  (Distinguish between Mobitz type I and Mobitz type II) -Order echocardiogram to assess cardiac structure and function. -Refer to electrophysiology for consultation and potential pacemaker placement.

## 2023-03-27 NOTE — Assessment & Plan Note (Addendum)
Under the care of [facility].  Most likely diabetic nephropathy Recent labs were primarily for kidney function.  Creatinine 1.52. -Continue current management with nephrology.

## 2023-03-27 NOTE — Assessment & Plan Note (Addendum)
On Atorvastatin 40mg  and Zetia 10mg  daily. Recent cholesterol levels were outstanding.. -Continue current lipid-lowering regimen.;  On Jardiance 25mg  and Tradjenta 5mg  daily. Recent HbA1c was 6.4. Managed by PCP along with peripheral neuropathy. -Continue current antidiabetic regimen.

## 2023-03-27 NOTE — Assessment & Plan Note (Signed)
Likely multi-factorial with COPD and some deconditioning with age.  However, with bradycardia must consider the possibility of chronotropic incompetence and other potential more significant cardiac etiology.

## 2023-03-27 NOTE — Assessment & Plan Note (Signed)
Experiences heartburn and pain radiating to teeth with certain foods, relieved by cool liquids. On Protonix. -Continue Protonix as needed for heartburn.

## 2023-03-27 NOTE — Assessment & Plan Note (Signed)
Suggests underlying conduction disease.  Split S2 noted on exam.  Checking 2D echo.

## 2023-03-27 NOTE — Assessment & Plan Note (Signed)
Being followed by pulmonary medicine.  Previously managed with Advair and PRN albuterol, but apparently is not using Advair at this point. Infrequent use of Albuterol inhaler. -Continue Albuterol inhaler as needed.  -Continue follow-up with pulmonary medicine

## 2023-04-05 ENCOUNTER — Telehealth: Payer: Self-pay | Admitting: Internal Medicine

## 2023-04-05 ENCOUNTER — Telehealth: Payer: Self-pay | Admitting: Cardiology

## 2023-04-05 ENCOUNTER — Telehealth: Payer: Self-pay | Admitting: Home Health

## 2023-04-05 NOTE — Telephone Encounter (Signed)
 Called patient to speak with him regarding the monitor observations. He states that he was awake at 11:00 am coughing very hard. States he has a covid test pending results that he feels aweful today. Has been coughing all day. States doesn't have any chest pain or felt any different with the pauses on the monitor. Encouraged patient to notify his PCP to request some cough medicine if these episodes continue.

## 2023-04-05 NOTE — Telephone Encounter (Signed)
Calling with abnormal Zio Monitor results. Call transferred 

## 2023-04-05 NOTE — Telephone Encounter (Signed)
 Print out from [place] and last office note brought to Dr P [place] DOD for review.     Cardiac Monitor Alert  Date of alert:  04/05/2023   Patient Name: Sean Robertson  DOB: 15-Sep-1945  MRN: [id number]   [facility] Cardiologist: Bryan Lemma, MD  [facility] EP:  None    Monitor Information: Long Term Monitor-Live Telemetry [ZioAT]  Reason:  heart rate in the low 30s with heart block 2nd degree type 2 and 3rd degree. Ordering provider:  Ranae Palms MD   Alert 2nd degree AV Block, Type II Bradycardia - slowest HR: 31 3rd degree AV Block This is the 1st alert for this rhythm.   Next Cardiology Appointment   Date:  04/19/2023  Provider:  Dr Jimmey Ralph  The patient was contacted today.  He is asymptomatic.  However is coughing heavily, sick and awaiting covid test results.  Arrhythmia, symptoms and history reviewed with P [place] MD.   Plan:  Continue to monitor as patient has a history of heart block.      Delton Prairie, RN  04/05/2023 2:17 PM

## 2023-04-05 NOTE — Telephone Encounter (Signed)
 Called by [employer] about episodes of complete heart block with HR in the 30-50 bpm range.  I called and spoke with the patient about these episodes.  He continues to battle URI symptoms.  No lightheadedness, dizziness, syncope or presyncopal symptoms.  I instructed him to seek care (have someone else drive him or call EMS) if he develops any of the above symptoms, which he agreed to.  Otherwise, will also forward this message to P. [place], MD to have close follow up.   Victory Dakin, MD  Cardiology

## 2023-04-05 NOTE — Telephone Encounter (Signed)
 Received call from Carla at [facility] who informed me that at 10:59 this morning patient had an episode of heart block with heart rate range of 31-33 with 17.9 sec pause.  She will fax [name] the report as well.

## 2023-04-05 NOTE — Telephone Encounter (Addendum)
 [employer] called after hour line, reporting patient had complete heart block, 31bpm, at 349pm Est Time, lasted 10 seconds, underlying rhythm is 2nd Mobitz I with HR 30-50 bpm. Patient was not called.   Office note from earlier showed complete heart block 30s and 2nd degree AV Block, Type II. Patient was called and had no symptoms. Suppose to see EP 04/19/22. No plan change was made by DOD.   Will continue Zio, no change of plan at this time. Reviewed with Dr Herbie Baltimore, message sent to request sooner appt with EP.

## 2023-04-06 ENCOUNTER — Telehealth: Payer: Self-pay | Admitting: Student in an Organized Health Care Education/Training Program

## 2023-04-06 ENCOUNTER — Telehealth: Payer: Self-pay | Admitting: Cardiology

## 2023-04-06 NOTE — Telephone Encounter (Signed)
 Called and spoke to pt after receiving another critical monitor alert.  Pt states he is positive for Covid.  He has been sick since Monday.  His symptoms have improved.  Encouraged pt to stay very well hydrated and drink more water and less Pepsi.    Dr. Tresa Endo (DOD) reviewed strips.  Pt will be seen by Dr. Jimmey Ralph at [facility] office on 04/12/23.  Will route to scheduling team.  Dr. Tresa Endo asking if pt can be seen sooner if at all possible.  Called and advised patient to go to ER if any feelings of dizziness/lightheadedness, CP.   Pt verbalized understanding.

## 2023-04-06 NOTE — Telephone Encounter (Signed)
 [place] with [employer] is calling to report urgent Ziio results.  Phone#: [phone] ref#: [id number]

## 2023-04-06 NOTE — Telephone Encounter (Signed)
 Notified by [employer] that the patient had a short run of  2nd degree Mobitz AVB at around 6:30pm. I was notified at 8:54 pm. They called the patient and he was reportedly asymptomatic. I attempted to call the patient, but it went straight to voicemail. On chart review, the patient is known to have Mobitz 2/High degree AVB and has had these episodes at least since 03/23/23. He has been asymptomatic each time. He is pending EP evaluation.   Karl Ito, MD Cardiology

## 2023-04-07 ENCOUNTER — Telehealth: Payer: Self-pay | Admitting: Internal Medicine

## 2023-04-07 ENCOUNTER — Telehealth: Payer: Self-pay | Admitting: Student in an Organized Health Care Education/Training Program

## 2023-04-07 ENCOUNTER — Telehealth: Payer: Self-pay | Admitting: Student

## 2023-04-07 ENCOUNTER — Telehealth: Payer: Self-pay | Admitting: Cardiology

## 2023-04-07 NOTE — Telephone Encounter (Signed)
 Contacted by [employer] regarding possible 3 second and an 8 second run of complete heart block with return of normal rhythm.  [employer] tried contacting patient but unable to reach.  Patient was likely sleeping.  [employer] will fax over the report for further review.

## 2023-04-07 NOTE — Telephone Encounter (Signed)
   Received a page from [facility] about an abnormal EKG. Called and spoke with rep. Patient had a 29 second episode of CHB at 5:16am EST with heart rates dropping as low as 27 bpm and then another 23 second episode of CHB at 5:24am with heart rates dropping as low as 28 bpm. This was an automatic trigger. Patient has known high-grade AV block/ CHB and is scheduled to see EP next week. We have received multiple notifications over the last few days about episodes of high grade AV block/ CHB. In fact, this is the 3rd time we were called since last night.   Called and spoke with patient. He sounds congested on the phone and is currently recovering from COVID but asymptomatic from a cardiac standpoint point. No lightheadedness/ dizziness, chest pain, or shortness of breath. He is not on any AV nodal agents. No new recommendations at this time. Advised patient to keep appointment with EP next week on 04/12/2023. However, discussed importance of going to the ED if he develops any lightheadedness/ dizziness, or near syncope. He voiced understanding and agreed.  Will route this note to Dr. Herbie Baltimore just so he is aware.  Corrin Parker, PA-C 04/07/2023 7:26 AM

## 2023-04-07 NOTE — Telephone Encounter (Signed)
   Cardiac Monitor Alert  Date of alert:  04/07/2023   Patient Name: Sean Robertson  DOB: [birth year]  MRN: [id number]   [facility] Cardiologist: Bryan Lemma, MD  [facility] EP:  None  (Scheduled to see Nobie Putnam, MD on 04/12/2023)  Monitor Information: Long Term Monitor-Live Telemetry [ZioAT]  Reason:  Mobitz type 2 block, DOE, left anterior fascicular block, RBBB  Ordering provider:  Dr. Herbie Baltimore   Alert Mobitz Type II Block; Erie Noe from [place] called in to Triage; per Erie Noe, 5 episodes of bradycardia, HR=29-32, 7 minutes Total Time; These alerts occurred today from 11:52 am-12:41 pm EST time; they contacted pt and he was at lunch and denied symptoms.   This is the 5th alert for this rhythm.   Next Cardiology Appointment   Date:  04/12/2023  Provider:  Nobie Putnam, MD  The patient was contacted today, after the phone call with [employer].  He states he was making a sandwich and ate his lunch.  He was very appreciative of the phone call.  ED precautions reviewed; pt verbalized understanding.  His Covid Symptoms continue to improve; they prescribed a new medication for this which he thinks is helping. He is asymptomatic.  Arrhythmia, symptoms and history reviewed with DOD, Dr. Bjorn Pippin.   Plan:  Continue to monitor.  No new orders. If patient develops symptoms, he should go to Emergency Dept.      Candace Cruise [place], [place]  04/07/2023 2:04 PM

## 2023-04-07 NOTE — Telephone Encounter (Signed)
 Notified once more by [employer] that the patient is having high degree AVB and now short runs of complete heart block. Both [employer] and I have attempted to call the patient to ascertain symptomatology, but he has not answered. His AVB and complete heart block are both known issues. I will send a message to his primary cardiologist regarding these notifications.

## 2023-04-07 NOTE — Telephone Encounter (Signed)
 [employer] is calling to give an abnormal report on patient

## 2023-04-07 NOTE — Telephone Encounter (Signed)
   Cardiac Monitor Alert  Date of alert:  04/07/2023   Patient Name: Sean Robertson  DOB: 16-Jan-1946  MRN: [id number]   [facility] Cardiologist: Bryan Lemma, MD  [facility] EP:  None    Monitor Information: Long Term Monitor-Live Telemetry [ZioAT]  Reason:  Mobitz type 2 block, DOE, left anterior fascicular block, RBBB Ordering provider:  Dr. Herbie Baltimore    Alert 2nd Degree AV Block type II 30-41 BPM Lasted 90 seconds High grade AV Block 29 BPM for 5 beats  This is the 4th alert for this rhythm.   Next Cardiology Appointment   Date:  04/12/23  Provider:  Dr. Jimmey Ralph, [facility] Booty  The patient was contacted today.  He is asymptomatic. Patient denies:   -lightheadedness/dizziness   -fatigue   -SOB  ED warning signs/precautions reviewed  Arrhythmia, symptoms and history reviewed with DOD Dr. Bjorn Pippin.   Plan:  Follow up at 3/4 OV with Dr. Jimmey Ralph    Other:  Marilynn Rail, RN  04/07/2023 8:49 AM

## 2023-04-07 NOTE — Telephone Encounter (Signed)
 Caller Helmut Muster) reporting abnormal results.  Reference# [id number].

## 2023-04-07 NOTE — Telephone Encounter (Signed)
   Cardiac Monitor Alert  Date of alert:  04/07/2023   Patient Name: Sean Robertson  DOB: January 06, 1946  MRN: [id number]   [facility] Cardiologist: Bryan Lemma, MD  [facility] EP:  None currently, scheduled to see Nobie Putnam, MD on 04/12/2023.   Monitor Information: Long Term Monitor-Live Telemetry [ZioAT]  Reason:  Mobitz type 2 block, DOE, left anterior fascicular block, RBBB  Ordering provider:  Dr. Herbie Baltimore { Alert Mobitz Type II Block; Paula Compton from [facility] called in to Triage; per Paula Compton, HR=30-31, 3 minutes total time; Occurred today at 2:32 pm.  Paula Compton asked if parameters should be changed.  The patient was contacted today, after the phone call with [employer].  He states he continues to feel fine and denies any symptoms.  ED precautions reviewed; advised pt that if he has the slightest symptom, to call 911 or have someone drive him to ED; pt verbalized understanding. This is the 7th alert today, per [place].    Plan: Reviewed with DOD (Dr. Bjorn Pippin); continue to monitor; current parameters: When average HR=30 or less, Critical alert call/strip/triage notification; Parameters were changed, with Dr. Campbell Lerner approval for:  When average HR=28 or less, Critical alert call/strip/triage notification.  This will ONLY APPLY to the current rhythm (Mobitz Type II Block); IF pt is in Complete Heart Block and/or there are pauses, then there will be a Critical alert call/strip/triage notification.  This change in parameter will be through end of wear time (12 additional days from today per [employer]).    Candace Cruise [place], [place]  04/07/2023 5:00 PM

## 2023-04-07 NOTE — Telephone Encounter (Signed)
 [employer] is calling back about patient to give an abnormal EKG reading

## 2023-04-08 ENCOUNTER — Telehealth: Payer: Self-pay | Admitting: Cardiology

## 2023-04-08 ENCOUNTER — Encounter: Payer: Self-pay | Admitting: *Deleted

## 2023-04-08 ENCOUNTER — Telehealth: Payer: Self-pay | Admitting: Internal Medicine

## 2023-04-08 ENCOUNTER — Encounter: Payer: Self-pay | Admitting: Cardiology

## 2023-04-08 NOTE — Telephone Encounter (Signed)
 Patient has scheduled visit with EP next week

## 2023-04-08 NOTE — Telephone Encounter (Signed)
 Zio by Georges Mouse is calling to report abnormal reading.  Ref # [id number]

## 2023-04-08 NOTE — Telephone Encounter (Signed)
 [employer] called the after hours line to discuss concern for type 2 AV block (possible Mobitz II).  The event lasted 27 seconds.  Patient was likely sleeping.  They called patient but unable to reach.

## 2023-04-08 NOTE — Telephone Encounter (Signed)
 Please see documentation in alternate 2/28 telephone encounter

## 2023-04-08 NOTE — Telephone Encounter (Signed)
 Dr. Geoffery Lyons office is calling today after receiving letter stating they need the last OV note, EKG and most recent labs. She states this had already been send after his last appt with them and she states she can see that patient had EKG with Dr. Herbie Baltimore on 03/23/2023. Requesting call back to confirm the fax they sent previously was received as they've documented and to get information as to why EKG notes are needed if EKG with Dr. Herbie Baltimore was the most recent. Please advise.

## 2023-04-08 NOTE — Telephone Encounter (Signed)
   Cardiac Monitor Alert  Date of alert:  04/08/2023   Patient Name: Sean Robertson  DOB: November 07, 1945  MRN: [id number]   [facility] Cardiologist: Bryan Lemma, MD  [facility] EP:  None    Monitor Information: Long Term Monitor-Live Telemetry [ZioAT]  Reason:  bradycardia, mobitz ll block  Ordering provider:  Dr Herbie Baltimore   Alert 2nd degree AV Block, Type II Bradycardia - slowest HR: 26  and 25     Alert is for 04/08/23 x 2 at 2:56 am  and 3 am  This is the  12  alert for this rhythm.   Next Cardiology Appointment   Date:  04/12/23  Provider:  Dr Nobie Putnam  The patient was contacted today.  He is asymptomatic.  Arrhythmia, symptoms and history reviewed with  Dr Rennis Golden  and Dr Herbie Baltimore .   Plan:  Per Dr Herbie Baltimore, patient's can stop monitoring & calls.   Other:  Patient is aware to take the monitor off and return to company . Patient has an appointment 04/12/23 with Dr Jimmey Ralph. Patient is aware if symptomatic  call 911 go to ER ( or family member)   Tobin Chad, RN  04/08/2023 8:35 AM

## 2023-04-08 NOTE — Telephone Encounter (Signed)
 Patient identification verified by 2 forms. Shade Flood, RN     Patient has had several critical monitors in the last 24hrs. Encounter forwarded to primary cardiologist for next steps to before patient appt with Dr. Jimmey Ralph on Monday.

## 2023-04-08 NOTE — Telephone Encounter (Signed)
 [employer] is calling to report an abnormal EKG on patient

## 2023-04-11 NOTE — H&P (View-Only) (Signed)
  Electrophysiology Office Note:   Date:  04/12/2023  ID:  Sean Robertson, DOB 10/19/1945, MRN [id number]  Primary Cardiologist: Bryan Lemma, MD Electrophysiologist: Nobie Putnam, MD      History of Present Illness:   Sean Robertson is a 78 y.o. male with h/o HTN, stage III CKD, DM, HLD who is being seen today for evaluation of his bradycardia at the request of Dr. Herbie Baltimore.   Discussed the use of AI scribe software for clinical note transcription with the patient, who gave verbal consent to proceed.  History of Present Illness   The patient, with a history of slow heart rate, was referred to cardiology by Dr. Duaine Dredge. The patient's heart rate is consistently in the thirties, which is abnormal and likely contributing to the patient's reported fatigue and limited energy. The patient's heart condition has been present for some time, as evidenced by an EKG from the previous year. The patient recently wore a monitor, which provided additional information about the heart's abnormal response to exertion. The patient also recently had COVID-19, which may have exacerbated his symptoms. He continues to work regularly but has no exertional capacity. He denies syncope.      Review of systems complete and found to be negative unless listed in HPI.   EP Information / Studies Reviewed:    EKG is ordered today. Personal review as below.      Physical Exam:   VS:  BP (!) 144/66 (BP Location: Left Arm, Patient Position: Sitting, Cuff Size: Normal)   Pulse (!) 35   Ht 5\' 8"  (1.727 m)   Wt 169 lb (76.7 kg)   SpO2 97%   BMI 25.70 kg/m    Wt Readings from Last 3 Encounters:  04/12/23 169 lb (76.7 kg)  03/23/23 179 lb 6.4 oz (81.4 kg)  03/31/22 170 lb (77.1 kg)     GEN: Well nourished, well developed in no acute distress NECK: No JVD CARDIAC: Bradycardic, regular rhythm. RESPIRATORY:  Clear to auscultation without rales, wheezing or rhonchi  ABDOMEN: Soft, non-distended EXTREMITIES:  No edema;  No deformity   ASSESSMENT AND PLAN:    #. 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 #. 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 Explained risks, benefits, and alternatives to pacemaker implantation, including but not limited to bleeding, infection, damage to heart or lungs, heart attack, stroke, or death.  Pt verbalized understanding and agrees to proceed. We will expedite implant due to extent of conduction disease. - We will arrange for echocardiogram prior to implant.  #Hypertension -Above goal today.  Recommend checking blood pressures 1-2 times per week at home and recording the values.  Recommend bringing these recordings to the primary care physician.   Signed, Nobie Putnam, MD

## 2023-04-11 NOTE — Progress Notes (Unsigned)
  Electrophysiology Office Note:   Date:  04/12/2023  ID:  BREVEN GUIDROZ, DOB 10/19/1945, MRN [id number]  Primary Cardiologist: Bryan Lemma, MD Electrophysiologist: Nobie Putnam, MD      History of Present Illness:   Sean Robertson is a 78 y.o. male with h/o HTN, stage III CKD, DM, HLD who is being seen today for evaluation of his bradycardia at the request of Dr. Herbie Baltimore.   Discussed the use of AI scribe software for clinical note transcription with the patient, who gave verbal consent to proceed.  History of Present Illness   The patient, with a history of slow heart rate, was referred to cardiology by Dr. Duaine Dredge. The patient's heart rate is consistently in the thirties, which is abnormal and likely contributing to the patient's reported fatigue and limited energy. The patient's heart condition has been present for some time, as evidenced by an EKG from the previous year. The patient recently wore a monitor, which provided additional information about the heart's abnormal response to exertion. The patient also recently had COVID-19, which may have exacerbated his symptoms. He continues to work regularly but has no exertional capacity. He denies syncope.      Review of systems complete and found to be negative unless listed in HPI.   EP Information / Studies Reviewed:    EKG is ordered today. Personal review as below.      Physical Exam:   VS:  BP (!) 144/66 (BP Location: Left Arm, Patient Position: Sitting, Cuff Size: Normal)   Pulse (!) 35   Ht 5\' 8"  (1.727 m)   Wt 169 lb (76.7 kg)   SpO2 97%   BMI 25.70 kg/m    Wt Readings from Last 3 Encounters:  04/12/23 169 lb (76.7 kg)  03/23/23 179 lb 6.4 oz (81.4 kg)  03/31/22 170 lb (77.1 kg)     GEN: Well nourished, well developed in no acute distress NECK: No JVD CARDIAC: Bradycardic, regular rhythm. RESPIRATORY:  Clear to auscultation without rales, wheezing or rhonchi  ABDOMEN: Soft, non-distended EXTREMITIES:  No edema;  No deformity   ASSESSMENT AND PLAN:    #. 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 #. 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 Explained risks, benefits, and alternatives to pacemaker implantation, including but not limited to bleeding, infection, damage to heart or lungs, heart attack, stroke, or death.  Pt verbalized understanding and agrees to proceed. We will expedite implant due to extent of conduction disease. - We will arrange for echocardiogram prior to implant.  #Hypertension -Above goal today.  Recommend checking blood pressures 1-2 times per week at home and recording the values.  Recommend bringing these recordings to the primary care physician.   Signed, Nobie Putnam, MD

## 2023-04-12 ENCOUNTER — Ambulatory Visit: Payer: 59 | Attending: Cardiology | Admitting: Cardiology

## 2023-04-12 ENCOUNTER — Encounter: Payer: Self-pay | Admitting: Cardiology

## 2023-04-12 VITALS — BP 144/66 | HR 35 | Ht 68.0 in | Wt 169.0 lb

## 2023-04-12 DIAGNOSIS — I10 Essential (primary) hypertension: Secondary | ICD-10-CM

## 2023-04-12 DIAGNOSIS — I441 Atrioventricular block, second degree: Secondary | ICD-10-CM | POA: Diagnosis not present

## 2023-04-12 DIAGNOSIS — I4510 Unspecified right bundle-branch block: Secondary | ICD-10-CM | POA: Diagnosis not present

## 2023-04-12 DIAGNOSIS — I444 Left anterior fascicular block: Secondary | ICD-10-CM

## 2023-04-12 DIAGNOSIS — R001 Bradycardia, unspecified: Secondary | ICD-10-CM | POA: Diagnosis not present

## 2023-04-12 NOTE — Patient Instructions (Addendum)
 Medication Instructions:  Your physician recommends that you continue on your current medications as directed. Please refer to the Current Medication list given to you today.  *If you need a refill on your cardiac medications before your next appointment, please call your pharmacy*  Lab Work: TODAY: BMET and CBC  Testing/Procedures: Echocardiogram Your physician has requested that you have an echocardiogram. Echocardiography is a painless test that uses sound waves to create images of your heart. It provides your doctor with information about the size and shape of your heart and how well your heart's chambers and valves are working. This procedure takes approximately one hour. There are no restrictions for this procedure. Please do NOT wear cologne, perfume, aftershave, or lotions (deodorant is allowed). Please arrive 15 minutes prior to your appointment time.  Please note: We ask at that you not bring children with you during ultrasound (echo/ vascular) testing. Due to room size and safety concerns, children are not allowed in the ultrasound rooms during exams. Our front office staff cannot provide observation of children in our lobby area while testing is being conducted. An adult accompanying a patient to their appointment will only be allowed in the ultrasound room at the discretion of the ultrasound technician under special circumstances. We apologize for any inconvenience.  Pacemaker Implant Your physician has recommended that you have a pacemaker inserted. A pacemaker is a small device that is placed under the skin of your chest or abdomen to help control abnormal heart rhythms. This device uses electrical pulses to prompt the heart to beat at a normal rate. Pacemakers are used to treat heart rhythms that are too slow. Wire (leads) are attached to the pacemaker that goes into the chambers of you heart. This is done in the hospital and usually requires and overnight stay. Please see the  instruction sheet given to you today for more information.  Follow-Up: At [facility], you and your health needs are our priority.  As part of our continuing mission to provide you with exceptional heart care, we have created designated Provider Care Teams.  These Care Teams include your primary Cardiologist (physician) and Advanced Practice Providers (APPs -  Physician Assistants and Nurse Practitioners) who all work together to provide you with the care you need, when you need it.  Your next appointment:   We will call you to schedule your follow up appointments

## 2023-04-13 ENCOUNTER — Ambulatory Visit: Attending: Cardiology

## 2023-04-13 LAB — CBC WITH DIFFERENTIAL/PLATELET
Basophils Absolute: 0.1 10*3/uL (ref 0.0–0.2)
Basos: 1 %
EOS (ABSOLUTE): 0.1 10*3/uL (ref 0.0–0.4)
Eos: 1 %
Hematocrit: 49.6 % (ref 37.5–51.0)
Hemoglobin: 16.6 g/dL (ref 13.0–17.7)
Immature Grans (Abs): 0.1 10*3/uL (ref 0.0–0.1)
Immature Granulocytes: 1 %
Lymphocytes Absolute: 1.7 10*3/uL (ref 0.7–3.1)
Lymphs: 21 %
MCH: 27.1 pg (ref 26.6–33.0)
MCHC: 33.5 g/dL (ref 31.5–35.7)
MCV: 81 fL (ref 79–97)
Monocytes Absolute: 0.8 10*3/uL (ref 0.1–0.9)
Monocytes: 10 %
Neutrophils Absolute: 5.5 10*3/uL (ref 1.4–7.0)
Neutrophils: 66 %
Platelets: 347 10*3/uL (ref 150–450)
RBC: 6.12 x10E6/uL — ABNORMAL HIGH (ref 4.14–5.80)
RDW: 13.1 % (ref 11.6–15.4)
WBC: 8.2 10*3/uL (ref 3.4–10.8)

## 2023-04-13 LAB — BASIC METABOLIC PANEL
BUN/Creatinine Ratio: 19 (ref 10–24)
BUN: 44 mg/dL — ABNORMAL HIGH (ref 8–27)
CO2: 18 mmol/L — ABNORMAL LOW (ref 20–29)
Calcium: 9.4 mg/dL (ref 8.6–10.2)
Chloride: 100 mmol/L (ref 96–106)
Creatinine, Ser: 2.31 mg/dL — ABNORMAL HIGH (ref 0.76–1.27)
Glucose: 124 mg/dL — ABNORMAL HIGH (ref 70–99)
Potassium: 4.5 mmol/L (ref 3.5–5.2)
Sodium: 137 mmol/L (ref 134–144)
eGFR: 28 mL/min/{1.73_m2} — ABNORMAL LOW (ref >59)

## 2023-04-14 NOTE — Pre-Procedure Instructions (Signed)
Instructed patient on the following items: Arrival time 2:00 Nothing to eat or drink after midnight No meds AM of procedure Responsible person to drive you home and stay with you for 24 hrs Wash with special soap night before and morning of procedure

## 2023-04-15 ENCOUNTER — Ambulatory Visit (HOSPITAL_COMMUNITY)
Admission: RE | Admit: 2023-04-15 | Discharge: 2023-04-15 | Disposition: A | Discharge status: Home / Self Care | Attending: Cardiology | Admitting: Cardiology

## 2023-04-15 ENCOUNTER — Other Ambulatory Visit: Payer: Self-pay

## 2023-04-15 ENCOUNTER — Ambulatory Visit (HOSPITAL_COMMUNITY)

## 2023-04-15 ENCOUNTER — Encounter (HOSPITAL_COMMUNITY)
Admission: RE | Disposition: A | Discharge status: Home / Self Care | Payer: Self-pay | Source: Home / Self Care | Attending: Cardiology

## 2023-04-15 DIAGNOSIS — N183 Chronic kidney disease, stage 3 unspecified: Secondary | ICD-10-CM | POA: Insufficient documentation

## 2023-04-15 DIAGNOSIS — I081 Rheumatic disorders of both mitral and tricuspid valves: Secondary | ICD-10-CM | POA: Diagnosis not present

## 2023-04-15 DIAGNOSIS — I441 Atrioventricular block, second degree: Secondary | ICD-10-CM | POA: Diagnosis present

## 2023-04-15 DIAGNOSIS — E785 Hyperlipidemia, unspecified: Secondary | ICD-10-CM | POA: Diagnosis not present

## 2023-04-15 DIAGNOSIS — E1122 Type 2 diabetes mellitus with diabetic chronic kidney disease: Secondary | ICD-10-CM | POA: Diagnosis not present

## 2023-04-15 DIAGNOSIS — I129 Hypertensive chronic kidney disease with stage 1 through stage 4 chronic kidney disease, or unspecified chronic kidney disease: Secondary | ICD-10-CM | POA: Diagnosis not present

## 2023-04-15 DIAGNOSIS — I452 Bifascicular block: Secondary | ICD-10-CM | POA: Insufficient documentation

## 2023-04-15 DIAGNOSIS — R001 Bradycardia, unspecified: Secondary | ICD-10-CM | POA: Diagnosis not present

## 2023-04-15 HISTORY — PX: PACEMAKER IMPLANT: EP1218

## 2023-04-15 LAB — ECHOCARDIOGRAM COMPLETE
Area-P 1/2: 3.81 cm2
Height: 68 in
S' Lateral: 3.3 cm
Weight: 2752 [oz_av]

## 2023-04-15 LAB — GLUCOSE, CAPILLARY
Glucose-Capillary: 110 mg/dL — ABNORMAL HIGH (ref 70–99)
Glucose-Capillary: 118 mg/dL — ABNORMAL HIGH (ref 70–99)

## 2023-04-15 SURGERY — PACEMAKER IMPLANT

## 2023-04-15 MED ORDER — CEFAZOLIN SODIUM-DEXTROSE 2-4 GM/100ML-% IV SOLN: 2.0000 g | INTRAVENOUS | Status: AC

## 2023-04-15 MED ORDER — FENTANYL CITRATE (PF) 100 MCG/2ML IJ SOLN
INTRAMUSCULAR | Status: AC
  Filled 2023-04-15: qty 2

## 2023-04-15 MED ORDER — MIDAZOLAM HCL 2 MG/2ML IJ SOLN
INTRAMUSCULAR | Status: AC
  Filled 2023-04-15: qty 2

## 2023-04-15 MED ORDER — SODIUM CHLORIDE 0.9 % IV SOLN: 80.0000 mg | INTRAVENOUS | Status: AC

## 2023-04-15 MED ORDER — HEPARIN (PORCINE) IN NACL 1000-0.9 UT/500ML-% IV SOLN
INTRAVENOUS | Status: DC | PRN
  Administered 2023-04-15: 500 mL

## 2023-04-15 MED ORDER — FENTANYL CITRATE (PF) 100 MCG/2ML IJ SOLN
INTRAMUSCULAR | Status: DC | PRN
  Administered 2023-04-15 (×2): 12.5 ug via INTRAVENOUS

## 2023-04-15 MED ORDER — CEFAZOLIN SODIUM-DEXTROSE 2-4 GM/100ML-% IV SOLN
INTRAVENOUS | Status: AC
Start: 2023-04-15 — End: 2023-04-15
  Administered 2023-04-15: 2 g via INTRAVENOUS
  Filled 2023-04-15: qty 100

## 2023-04-15 MED ORDER — LIDOCAINE HCL (PF) 1 % IJ SOLN
INTRAMUSCULAR | Status: DC | PRN
  Administered 2023-04-15: 50 mL

## 2023-04-15 MED ORDER — POVIDONE-IODINE 10 % EX SWAB
2.0000 | Freq: Once | CUTANEOUS | Status: AC
  Administered 2023-04-15: 2 via TOPICAL

## 2023-04-15 MED ORDER — ONDANSETRON HCL 4 MG/2ML IJ SOLN: 4.0000 mg | Freq: Four times a day (QID) | INTRAMUSCULAR | Status: DC | PRN

## 2023-04-15 MED ORDER — ACETAMINOPHEN 325 MG PO TABS
325.0000 mg | ORAL_TABLET | ORAL | Status: DC | PRN
  Administered 2023-04-15: 650 mg via ORAL
  Filled 2023-04-15: qty 2

## 2023-04-15 MED ORDER — LIDOCAINE HCL (PF) 1 % IJ SOLN
INTRAMUSCULAR | Status: AC
  Filled 2023-04-15: qty 60

## 2023-04-15 MED ORDER — SODIUM CHLORIDE 0.9 % IV SOLN: INTRAVENOUS | Status: DC

## 2023-04-15 MED ORDER — CHLORHEXIDINE GLUCONATE 4 % EX SOLN: 4.0000 | Freq: Once | CUTANEOUS | Status: DC

## 2023-04-15 MED ORDER — SODIUM CHLORIDE 0.9 % IV SOLN
INTRAVENOUS | Status: AC
  Administered 2023-04-15: 80 mg
  Filled 2023-04-15: qty 2

## 2023-04-15 MED ORDER — MIDAZOLAM HCL 5 MG/5ML IJ SOLN
INTRAMUSCULAR | Status: DC | PRN
  Administered 2023-04-15 (×2): 1 mg via INTRAVENOUS

## 2023-04-15 SURGICAL SUPPLY — 13 items
CABLE SURGICAL S-101-97-12 (CABLE) ×1 IMPLANT
CATH RIGHTSITE C315HIS02 (CATHETERS) IMPLANT
IPG PACE AZUR XT DR MRI W1DR01 (Pacemaker) IMPLANT
KIT MICROPUNCTURE NIT STIFF (SHEATH) IMPLANT
LEAD CAPSURE NOVUS 5076-52CM (Lead) IMPLANT
LEAD SELECT SECURE 3830 383069 (Lead) IMPLANT
PACE AZURE XT DR MRI W1DR01 (Pacemaker) ×1 IMPLANT
PAD DEFIB RADIO PHYSIO CONN (PAD) ×1 IMPLANT
SELECT SECURE 3830 383069 (Lead) ×1 IMPLANT
SHEATH 7FR PRELUDE SNAP 13 (SHEATH) IMPLANT
SLITTER 6232ADJ (MISCELLANEOUS) IMPLANT
TRAY PACEMAKER INSERTION (PACKS) ×1 IMPLANT
WIRE HI TORQ VERSACORE-J 145CM (WIRE) IMPLANT

## 2023-04-15 NOTE — Discharge Instructions (Signed)
 After Your Pacemaker   You have a Medtronic Pacemaker  ACTIVITY Do not lift your arm above shoulder height for 1 week after your procedure. After 7 days, you may progress as below.  You should remove your sling 24 hours after your procedure, unless otherwise instructed by your provider.     Friday April 22, 2023  Saturday April 23, 2023 Sunday April 24, 2023 Monday April 25, 2023   Do not lift, push, pull, or carry anything over 10 pounds with the affected arm until 6 weeks (Friday May 27, 2023 ) after your procedure.   You may drive AFTER your wound check, unless you have been told otherwise by your provider.   Ask your healthcare provider when you can go back to work   INCISION/Dressing If you are on a blood thinner such as Coumadin, Xarelto, Eliquis, Plavix, or Pradaxa please confirm with your provider when this should be resumed.   If large square, outer bandage is left in place, this can be removed after 24 hours from your procedure. Do not remove steri-strips or glue as below.   If a PRESSURE DRESSING (a bulky dressing that usually goes up over your shoulder) was applied or left in place, please follow instructions given by your provider on when to return to have this removed.   Monitor your Pacemaker site for redness, swelling, and drainage. Call the device clinic at [phone] if you experience these symptoms or fever/chills.  If your incision is sealed with Steri-strips or staples, you may shower 7 days after your procedure or when told by your provider. Do not remove the steri-strips or let the shower hit directly on your site. You may wash around your site with soap and water.    If you were discharged in a sling, please do not wear this during the day more than 48 hours after your surgery unless otherwise instructed. This may increase the risk of stiffness and soreness in your shoulder.   Avoid lotions, ointments, or perfumes over your incision until it is  well-healed.  You may use a hot tub or a pool AFTER your wound check appointment if the incision is completely closed.  Pacemaker Alerts:  Some alerts are vibratory and others beep. These are NOT emergencies. Please call our office to let us know. If this occurs at night or on weekends, it can wait until the next business day. Send a remote transmission.  If your device is capable of reading fluid status (for heart failure), you will be offered monthly monitoring to review this with you.   DEVICE MANAGEMENT Remote monitoring is used to monitor your pacemaker from home. This monitoring is scheduled every 91 days by our office. It allows [name] to keep an eye on the functioning of your device to ensure it is working properly. You will routinely see your Electrophysiologist annually (more often if necessary).   You should receive your ID card for your new device in 4-8 weeks. Keep this card with you at all times once received. Consider wearing a medical alert bracelet or necklace.  Your Pacemaker may be MRI compatible. This will be discussed at your next office visit/wound check.  You should avoid contact with strong electric or magnetic fields.   Do not use amateur (ham) radio equipment or electric (arc) welding torches. MP3 player headphones with magnets should not be used. Some devices are safe to use if held at least 12 inches (30 cm) from your Pacemaker. These include power tools, lawn  mowers, and speakers. If you are unsure if something is safe to use, ask your health care provider.  When using your cell phone, hold it to the ear that is on the opposite side from the Pacemaker. Do not leave your cell phone in a pocket over the Pacemaker.  You may safely use electric blankets, heating pads, computers, and microwave ovens.  Call the office right away if: You have chest pain. You feel more short of breath than you have felt before. You feel more light-headed than you have felt before. Your  incision starts to open up.  This information is not intended to replace advice given to you by your health care provider. Make sure you discuss any questions you have with your health care provider.

## 2023-04-15 NOTE — Interval H&P Note (Addendum)
 History and Physical Interval Note:  04/15/2023 4:13 PM  Sean Robertson  has presented today for surgery, with the diagnosis of bradicardia.  The various methods of treatment have been discussed with the patient and family. After consideration of risks, benefits and other options for treatment, the patient has consented to  Procedure(s): PACEMAKER IMPLANT (N/A) as a surgical intervention.  The patient's history has been reviewed, patient examined, no change in status, stable for surgery.  I have reviewed the patient's chart and labs.  Questions were answered to the patient's satisfaction.    Addendum: his LV function is normal. We will plan a DDD PM with left bundle area pacing.   Lewayne Bunting

## 2023-04-17 ENCOUNTER — Encounter (HOSPITAL_COMMUNITY): Payer: Self-pay | Admitting: Internal Medicine

## 2023-04-19 ENCOUNTER — Institutional Professional Consult (permissible substitution): Payer: 59 | Admitting: Cardiology

## 2023-04-21 ENCOUNTER — Other Ambulatory Visit

## 2023-04-28 ENCOUNTER — Ambulatory Visit: Attending: Cardiology

## 2023-04-28 DIAGNOSIS — I441 Atrioventricular block, second degree: Secondary | ICD-10-CM

## 2023-04-28 LAB — CUP PACEART INCLINIC DEVICE CHECK
Battery Remaining Longevity: 134 mo
Battery Voltage: 3.22 V
Brady Statistic AP VP Percent: 45.47 %
Brady Statistic AP VS Percent: 0.01 %
Brady Statistic AS VP Percent: 54.18 %
Brady Statistic AS VS Percent: 0.34 %
Brady Statistic RA Percent Paced: 45.65 %
Brady Statistic RV Percent Paced: 99.65 %
Date Time Interrogation Session: 20250320125718
Implantable Lead Connection Status: 753985
Implantable Lead Connection Status: 753985
Implantable Lead Implant Date: 20250307
Implantable Lead Implant Date: 20250307
Implantable Lead Location: 753859
Implantable Lead Location: 753860
Implantable Lead Model: 3830
Implantable Lead Model: 5076
Implantable Pulse Generator Implant Date: 20250307
Lead Channel Impedance Value: 418 Ohm
Lead Channel Impedance Value: 475 Ohm
Lead Channel Impedance Value: 589 Ohm
Lead Channel Impedance Value: 646 Ohm
Lead Channel Pacing Threshold Amplitude: 0.75 V
Lead Channel Pacing Threshold Amplitude: 0.75 V
Lead Channel Pacing Threshold Pulse Width: 0.4 ms
Lead Channel Pacing Threshold Pulse Width: 0.4 ms
Lead Channel Sensing Intrinsic Amplitude: 17.375 mV
Lead Channel Sensing Intrinsic Amplitude: 3.75 mV
Lead Channel Sensing Intrinsic Amplitude: 4.25 mV
Lead Channel Setting Pacing Amplitude: 3.5 V
Lead Channel Setting Pacing Amplitude: 3.5 V
Lead Channel Setting Pacing Pulse Width: 0.4 ms
Lead Channel Setting Sensing Sensitivity: 1.2 mV
Zone Setting Status: 755011
Zone Setting Status: 755011

## 2023-04-28 LAB — LAB REPORT - SCANNED
Albumin, Urine POC: <3
Albumin/Creatinine Ratio, Urine, POC: <8
Creatinine, POC: 37.9 mg/dL
EGFR: 37

## 2023-04-28 NOTE — Progress Notes (Signed)
 Normal dual chamber pacemaker wound check. Presenting rhythm: AP/VP 67 . Wound well healed. Routine testing performed. Thresholds, sensing, and impedances consistent with implant measurements and at 3.5V safety margin/auto capture until 3 month visit. No episodes. Reviewed arm restrictions to continue for 6 weeks total post op.  Pt enrolled in remote follow-up.

## 2023-04-28 NOTE — Patient Instructions (Addendum)

## 2023-05-29 DIAGNOSIS — I444 Left anterior fascicular block: Secondary | ICD-10-CM

## 2023-05-29 DIAGNOSIS — I441 Atrioventricular block, second degree: Secondary | ICD-10-CM

## 2023-05-29 DIAGNOSIS — I4510 Unspecified right bundle-branch block: Secondary | ICD-10-CM | POA: Diagnosis not present

## 2023-05-29 DIAGNOSIS — R0609 Other forms of dyspnea: Secondary | ICD-10-CM

## 2023-05-31 ENCOUNTER — Ambulatory Visit

## 2023-05-31 DIAGNOSIS — I441 Atrioventricular block, second degree: Secondary | ICD-10-CM | POA: Diagnosis not present

## 2023-06-01 LAB — CUP PACEART REMOTE DEVICE CHECK
Battery Remaining Longevity: 133 mo
Battery Voltage: 3.21 V
Brady Statistic AP VP Percent: 48.79 %
Brady Statistic AP VS Percent: 0.05 %
Brady Statistic AS VP Percent: 50.92 %
Brady Statistic AS VS Percent: 0.25 %
Brady Statistic RA Percent Paced: 48.9 %
Brady Statistic RV Percent Paced: 99.71 %
Date Time Interrogation Session: 20250421211548
Implantable Lead Connection Status: 753985
Implantable Lead Connection Status: 753985
Implantable Lead Implant Date: 20250307
Implantable Lead Implant Date: 20250307
Implantable Lead Location: 753859
Implantable Lead Location: 753860
Implantable Lead Model: 3830
Implantable Lead Model: 5076
Implantable Pulse Generator Implant Date: 20250307
Lead Channel Impedance Value: 361 Ohm
Lead Channel Impedance Value: 456 Ohm
Lead Channel Impedance Value: 570 Ohm
Lead Channel Impedance Value: 684 Ohm
Lead Channel Pacing Threshold Amplitude: 0.625 V
Lead Channel Pacing Threshold Amplitude: 0.875 V
Lead Channel Pacing Threshold Pulse Width: 0.4 ms
Lead Channel Pacing Threshold Pulse Width: 0.4 ms
Lead Channel Sensing Intrinsic Amplitude: 2.875 mV
Lead Channel Sensing Intrinsic Amplitude: 2.875 mV
Lead Channel Sensing Intrinsic Amplitude: 31.625 mV
Lead Channel Sensing Intrinsic Amplitude: 31.625 mV
Lead Channel Setting Pacing Amplitude: 3.5 V
Lead Channel Setting Pacing Amplitude: 3.5 V
Lead Channel Setting Pacing Pulse Width: 0.4 ms
Lead Channel Setting Sensing Sensitivity: 1.2 mV
Zone Setting Status: 755011
Zone Setting Status: 755011

## 2023-06-20 ENCOUNTER — Other Ambulatory Visit (HOSPITAL_BASED_OUTPATIENT_CLINIC_OR_DEPARTMENT_OTHER): Payer: Self-pay | Admitting: Family Medicine

## 2023-06-20 DIAGNOSIS — E782 Mixed hyperlipidemia: Secondary | ICD-10-CM

## 2023-06-21 IMAGING — MR MR HEAD WO/W CM
11 of 14 series · 35 of 48 positions shown · IV contrast (multihance)
Comparison: MRI of the cervical spine 12/07/2013.

CLINICAL DATA: Provided history: Vision loss of left eye. Ataxia.

EXAM:
MRI HEAD WITHOUT AND WITH CONTRAST
MRA HEAD WITHOUT CONTRAST
TECHNIQUE: Multiplanar, multi-echo pulse sequences of the brain and surrounding
structures were acquired without and with intravenous contrast.
Angiographic images of the Circle of Willis were acquired using MRA
technique without intravenous contrast.
CONTRAST:  15mL MULTIHANCE GADOBENATE DIMEGLUMINE 529 MG/ML IV SOLN

[Series 5: T1 · sagittal · 4.0mm · 0.75mm/px · 2 of 31 slices shown (1 of 3)]
[im 1/31]
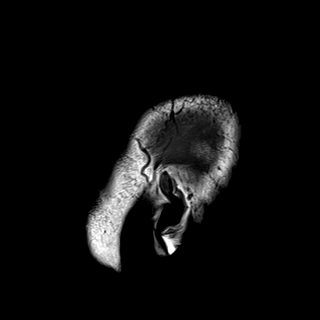
[im 31/31]
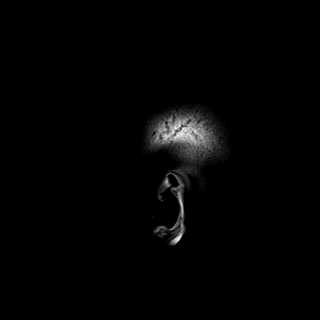

[Series 9: tof_fl3d_tra_p2_multi-slab · axial · 0.6mm · 0.26mm/px · z∈[-61,-9]mm · 4 of 175 slices shown]
[im 1/175]
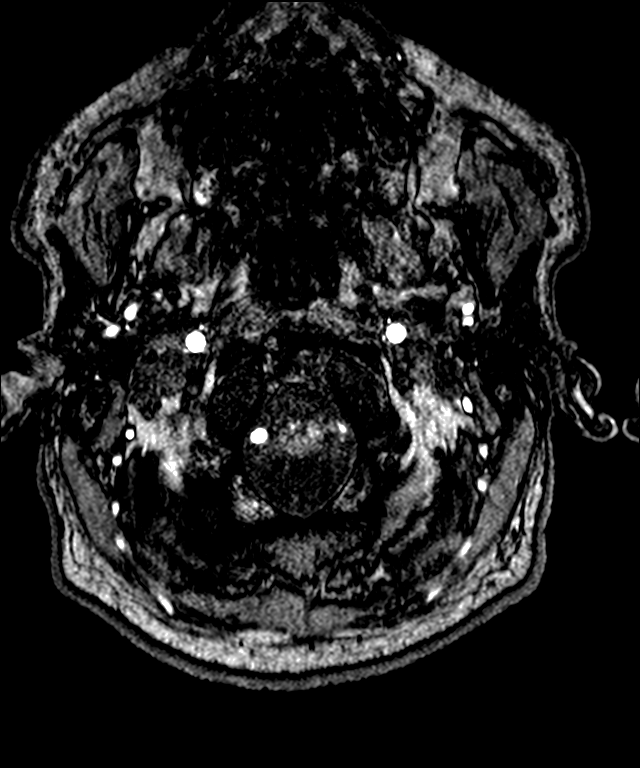
[im 30/175]
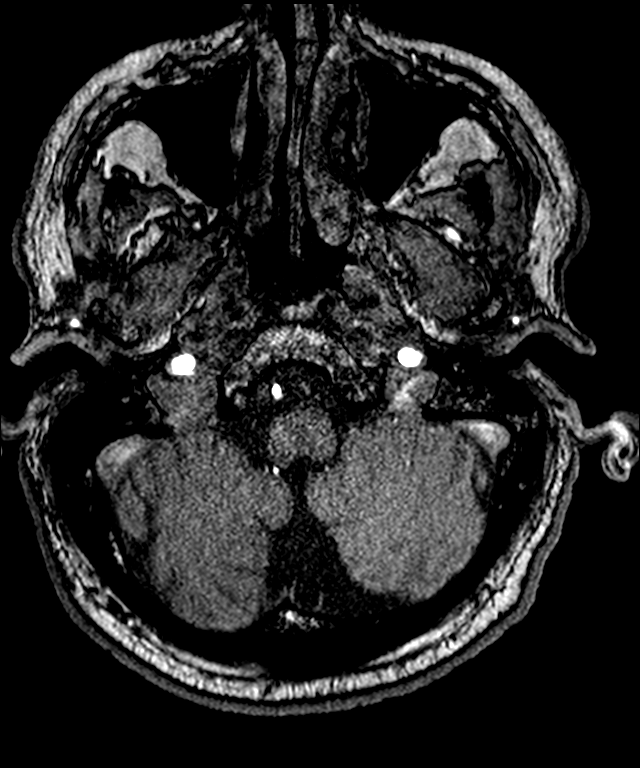
[im 59/175]
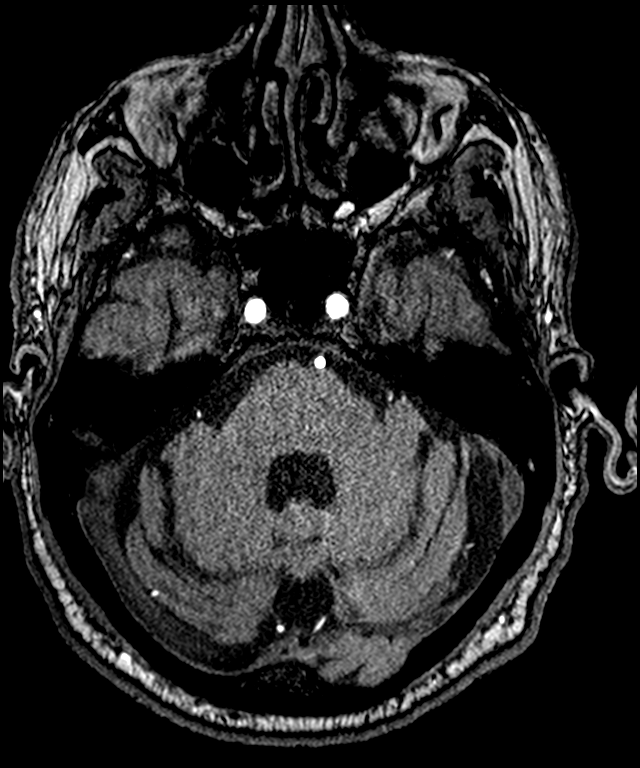
[im 88/175]
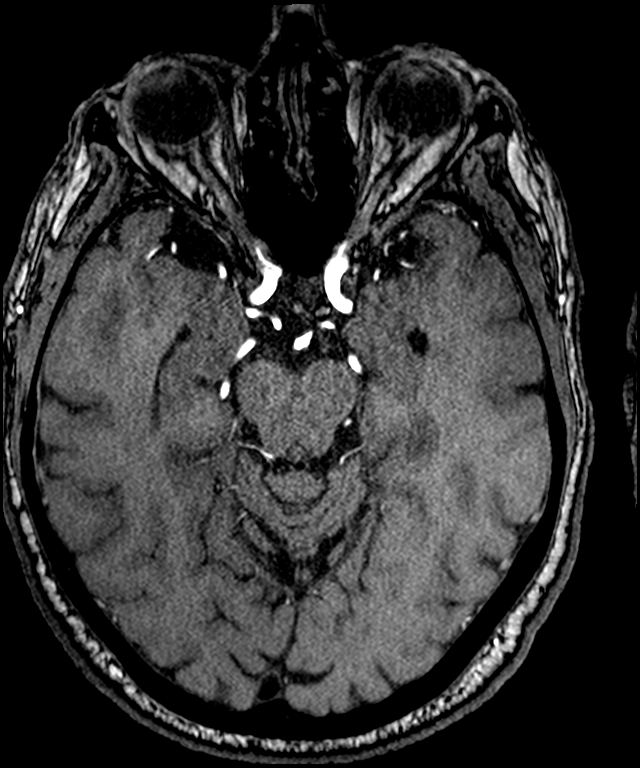

[Series 13: DWI · axial · 3.0mm · 0.94mm/px · z∈[-64,+87]mm · 7 of 172 slices shown (1 of 3)]
[im 1/172]
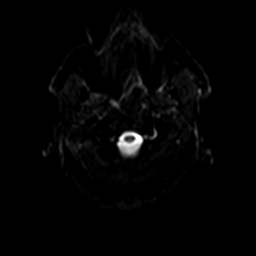
[im 29/172]
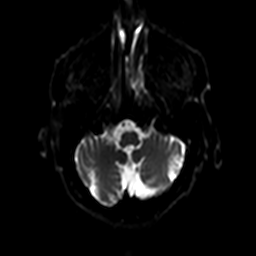
[im 58/172]
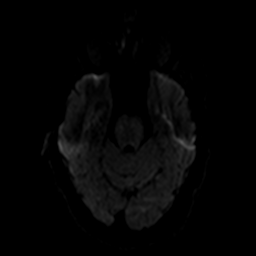
[im 86/172]
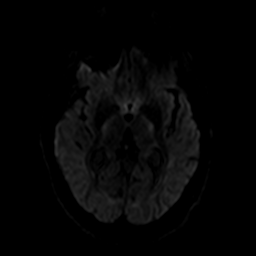
[im 115/172]
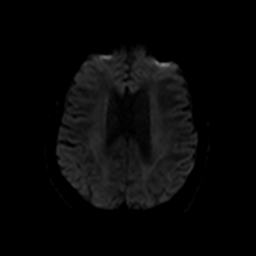
[im 143/172]
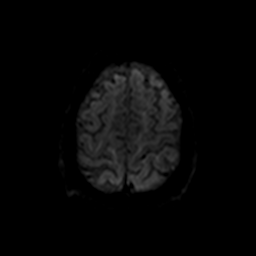
[im 172/172]
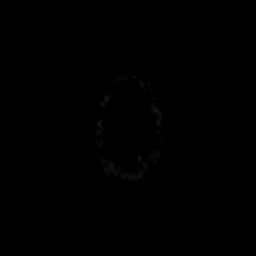

[Series 17: DWI · coronal · 5.0mm · 1.44mm/px · 3 of 66 slices shown (2 of 3)]
[im 1/66]
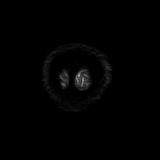
[im 33/66]
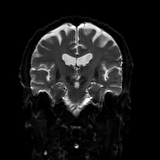
[im 66/66]
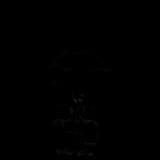

[Series 18: DWI · coronal · 5.0mm · 1.44mm/px · 1 of 33 slices shown (3 of 3)]
[im 1/33]
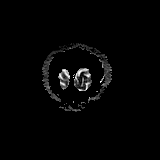

[Series 19: T2 · axial · 4.0mm · 0.36mm/px · 1 of 30 slices shown]
[im 1/30]
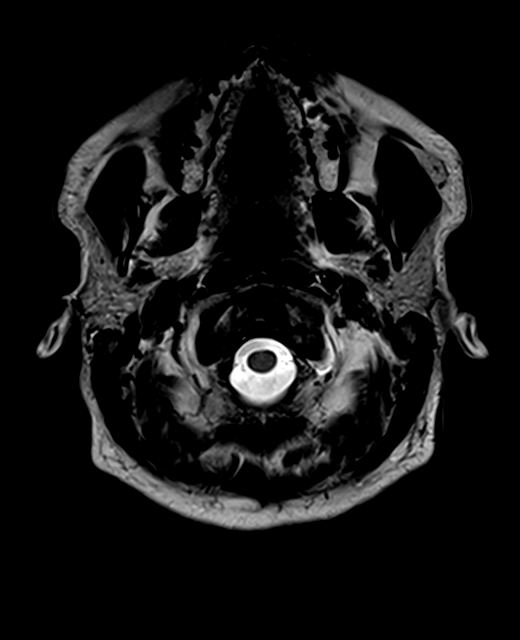

[Series 20: FLAIR · axial · 3.0mm · 0.72mm/px · 1 of 26 slices shown]
[im 1/26]
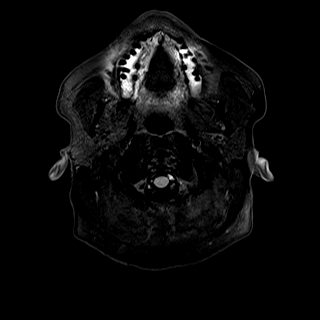

[Series 23: T1 · axial · 1.0mm · 0.94mm/px · z∈[-74,+85]mm · 7 of 160 slices shown (2 of 3)]
[im 1/160]
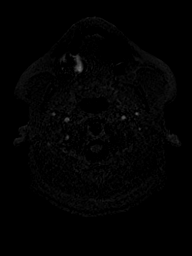
[im 27/160]
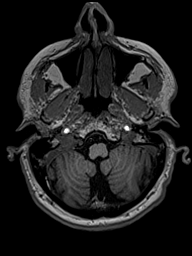
[im 54/160]
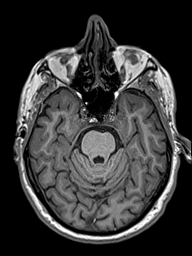
[im 80/160]
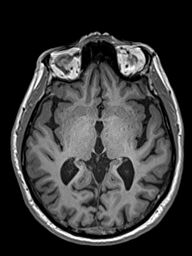
[im 107/160]
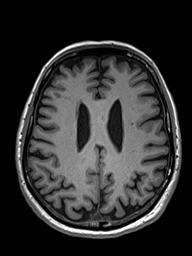
[im 133/160]
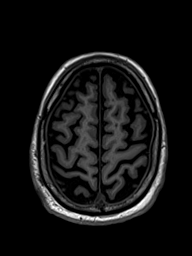
[im 160/160]
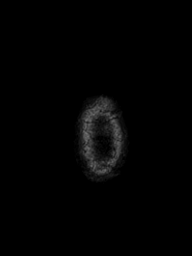

[Series 24: T2 post-contrast · coronal · 4.0mm · 0.36mm/px · 1 of 35 slices shown]
[im 1/35]
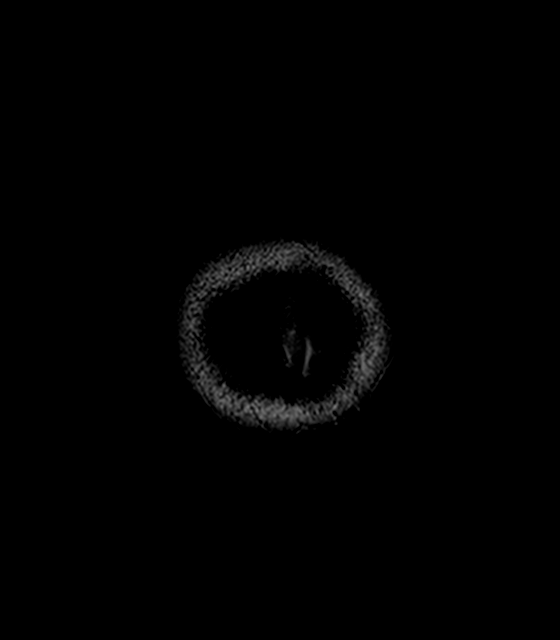

[Series 25: T1 · axial · 1.0mm · 0.94mm/px · z∈[-74,+85]mm · 7 of 160 slices shown (3 of 3)]
[im 1/160]
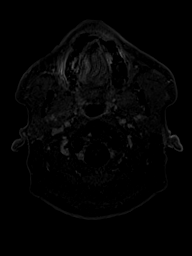
[im 27/160]
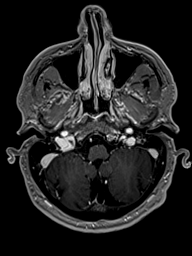
[im 54/160]
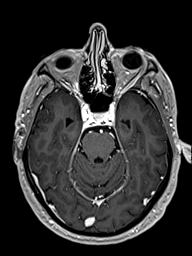
[im 80/160]
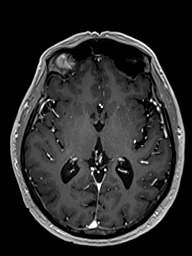
[im 107/160]
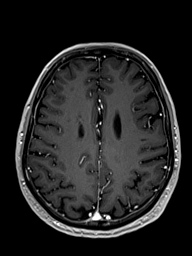
[im 133/160]
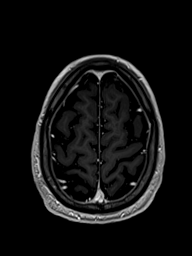
[im 160/160]
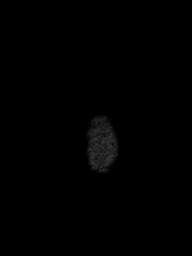

[Series 26: T1 post-contrast · coronal · 4.0mm · 0.72mm/px · 1 of 35 slices shown]
[im 1/35]
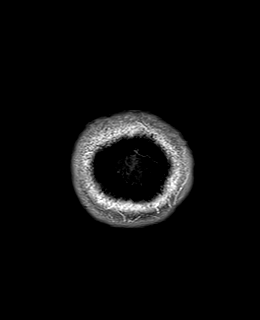

[35 of 48 positions shown; findings below may reference images not displayed]

FINDINGS: MRI HEAD FINDINGS

Brain:

Mild generalized cerebral and cerebellar atrophy.

Mild multifocal T2 FLAIR hyperintense signal abnormality within the
cerebral white matter, nonspecific but compatible with chronic small
vessel ischemic disease.

There is no acute infarct.

No evidence of an intracranial mass.

No chronic intracranial blood products.

No extra-axial fluid collection.

No midline shift.

No pathologic intracranial enhancement identified.

Vascular: Maintained flow voids within the proximal large arterial
vessels.

Skull and upper cervical spine: No focal suspicious marrow lesion.
Small well-circumscribed T2 hyperintense lesion within the posterior
aspect of the C2 body, stable as compared to the cervical spine MRI
of 12/07/2013 and likely benign.

Sinuses/Orbits: Visualized orbits show no acute finding. No
significant paranasal sinus disease.

MRA HEAD FINDINGS

Anterior circulation:

The intracranial internal carotid arteries are patent. The M1 middle
cerebral arteries are patent. No M2 proximal branch occlusion or
high-grade proximal stenosis is identified. The anterior cerebral
arteries are patent. No intracranial aneurysm is identified.

Posterior circulation:

The intracranial left vertebral artery is poorly delineated, likely
occluded. The left PICA is also poorly delineated. The intracranial
right vertebral artery is patent without stenosis. The basilar
artery is patent. The posterior cerebral arteries are patent.
Posterior communicating arteries are present bilaterally.

Anatomic variants: None significant.

MRA impression #1 will be called to the ordering clinician or
representative by the Radiologist Assistant, and communication
documented in the PACS or [REDACTED].
IMPRESSION: MRI brain:

1. No evidence of acute intracranial abnormality.
2. Mild chronic small vessel ischemic changes within the cerebral
white matter.
3. Mild generalized parenchymal atrophy.

MRA head:

1. The intracranial left vertebral artery is poorly delineated and
likely occluded. The left PICA is also poorly delineated.
2. Elsewhere, no intracranial large vessel occlusion or proximal
high-grade arterial stenosis is identified.

## 2023-07-14 ENCOUNTER — Ambulatory Visit (HOSPITAL_BASED_OUTPATIENT_CLINIC_OR_DEPARTMENT_OTHER)
Admission: RE | Admit: 2023-07-14 | Discharge: 2023-07-14 | Disposition: A | Discharge status: Home / Self Care | Payer: Self-pay | Source: Ambulatory Visit | Attending: Family Medicine | Admitting: Family Medicine

## 2023-07-14 DIAGNOSIS — E782 Mixed hyperlipidemia: Secondary | ICD-10-CM | POA: Insufficient documentation

## 2023-07-14 NOTE — Progress Notes (Signed)
 Remote pacemaker transmission.

## 2023-07-14 NOTE — Addendum Note (Signed)
 Addended by: Lott Rouleau A on: 07/14/2023 04:08 PM   Modules accepted: Orders

## 2023-07-18 NOTE — Progress Notes (Unsigned)
  Electrophysiology Office Note:   ID:  Wayland, Baik 06-May-1945, MRN [id number]  Primary Cardiologist: Randene Bustard, MD Electrophysiologist: Ardeen Kohler, MD  {Click to update primary MD,subspecialty MD or APP then REFRESH:1}    History of Present Illness:   JYMIR DUNAJ is a 78 y.o. male with h/o 2:1 AV block s/p PPM, CKD III, DM2, and HLD seen today for routine electrophysiology follow-up s/p Pacemaker implant.  Since last being seen in our clinic the patient reports doing ***.  he denies chest pain, palpitations, dyspnea, PND, orthopnea, nausea, vomiting, dizziness, syncope, edema, weight gain, or early satiety.    Review of systems complete and found to be negative unless listed in HPI.   EP Information / Studies Reviewed:    EKG is ordered today. Personal review as below.       PPM Interrogation-  reviewed in detail today,  See PACEART report.  Arrhythmia/Device History CARELINK PPM MEDTRONIC   Physical Exam:   VS:  There were no vitals taken for this visit.   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

## 2023-07-19 ENCOUNTER — Ambulatory Visit: Attending: Student | Admitting: Student

## 2023-07-19 ENCOUNTER — Encounter: Payer: Self-pay | Admitting: Student

## 2023-07-19 VITALS — BP 149/75 | HR 68 | Ht 68.0 in | Wt 170.0 lb

## 2023-07-19 DIAGNOSIS — I441 Atrioventricular block, second degree: Secondary | ICD-10-CM | POA: Diagnosis not present

## 2023-07-19 DIAGNOSIS — I10 Essential (primary) hypertension: Secondary | ICD-10-CM

## 2023-07-19 DIAGNOSIS — I444 Left anterior fascicular block: Secondary | ICD-10-CM

## 2023-07-19 DIAGNOSIS — I4510 Unspecified right bundle-branch block: Secondary | ICD-10-CM | POA: Diagnosis not present

## 2023-07-19 LAB — CUP PACEART INCLINIC DEVICE CHECK
Battery Remaining Longevity: 139 mo
Battery Voltage: 3.19 V
Brady Statistic AP VP Percent: 38.25 %
Brady Statistic AP VS Percent: 0.03 %
Brady Statistic AS VP Percent: 61.34 %
Brady Statistic AS VS Percent: 0.38 %
Brady Statistic RA Percent Paced: 38.44 %
Brady Statistic RV Percent Paced: 99.59 %
Date Time Interrogation Session: 20250610095802
Implantable Lead Connection Status: 753985
Implantable Lead Connection Status: 753985
Implantable Lead Implant Date: 20250307
Implantable Lead Implant Date: 20250307
Implantable Lead Location: 753859
Implantable Lead Location: 753860
Implantable Lead Model: 3830
Implantable Lead Model: 5076
Implantable Pulse Generator Implant Date: 20250307
Lead Channel Impedance Value: 399 Ohm
Lead Channel Impedance Value: 456 Ohm
Lead Channel Impedance Value: 532 Ohm
Lead Channel Impedance Value: 703 Ohm
Lead Channel Pacing Threshold Amplitude: 0.625 V
Lead Channel Pacing Threshold Amplitude: 0.875 V
Lead Channel Pacing Threshold Pulse Width: 0.4 ms
Lead Channel Pacing Threshold Pulse Width: 0.4 ms
Lead Channel Sensing Intrinsic Amplitude: 31.625 mV
Lead Channel Sensing Intrinsic Amplitude: 31.625 mV
Lead Channel Sensing Intrinsic Amplitude: 4.25 mV
Lead Channel Sensing Intrinsic Amplitude: 6 mV
Lead Channel Setting Pacing Amplitude: 2 V
Lead Channel Setting Pacing Amplitude: 2.5 V
Lead Channel Setting Pacing Pulse Width: 0.4 ms
Lead Channel Setting Sensing Sensitivity: 1.2 mV
Zone Setting Status: 755011
Zone Setting Status: 755011

## 2023-07-19 NOTE — Patient Instructions (Signed)
 Medication Instructions:  No medication changes today. *If you need a refill on your cardiac medications before your next appointment, please call your pharmacy*  Lab Work: No labwork ordered today. If you have labs (blood work) drawn today and your tests are completely normal, you will receive your results only by: MyChart Message (if you have MyChart) OR A paper copy in the mail If you have any lab test that is abnormal or we need to change your treatment, we will call you to review the results.  Testing/Procedures: No testing ordered today  Follow-Up: At [facility], you and your health needs are our priority.  As part of our continuing mission to provide you with exceptional heart care, our providers are all part of one team.  This team includes your primary Cardiologist (physician) and Advanced Practice Providers or APPs (Physician Assistants and Nurse Practitioners) who all work together to provide you with the care you need, when you need it.  Your next appointment:   12 month(s)  Provider:   You may see Ardeen Kohler, MD or one of the following Advanced Practice Providers on your designated Care Team:   Mertha Abrahams, Kennard Pea "Jonelle Neri" [place], PA-C Suzann Riddle, NP Creighton Doffing, NP    We recommend signing up for the patient portal called "MyChart".  Sign up information is provided on this After Visit Summary.  MyChart is used to connect with patients for Virtual Visits (Telemedicine).  Patients are able to view lab/test results, encounter notes, upcoming appointments, etc.  Non-urgent messages can be sent to your provider as well.   To learn more about what you can do with MyChart, go to [email or web address].

## 2023-07-22 ENCOUNTER — Ambulatory Visit: Payer: 59 | Attending: Cardiology | Admitting: Cardiology

## 2023-07-22 NOTE — Progress Notes (Deleted)
 Cardiology Office Note:  .   Date:  07/22/2023  ID:  Sean Robertson, DOB 06/20/1945, MRN [id number] PCP: Candise Chambers, MD  [facility] Providers Cardiologist:  Randene Bustard, MD Electrophysiologist:  Ardeen Kohler, MD { Click to update primary MD,subspecialty MD or APP then REFRESH:1}    No chief complaint on file.   Patient Profile: .     Sean Robertson is a *** 78 y.o. male *** with a PMH notable for HTN, DM-2, HLD, COPD (recurrent bouts of bronchitis) & CKD-3 who presents here for follow-up after PPM placement for high-grade AV block.   Sean Robertson was initially referred at the request of Candise Chambers, M/D for BRADYCARDIA - MOBITZ II AV BLOCK.  I saw him on February 12 f and plan for Zio patch monitor.  Evaluate the severity of his bradycardia and potential pauses potential high-grade AV block.  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  No sensation of lightheadedness or dizziness.  Occasional heartburn was the only symptom he noted other than exercise intolerance noting fatigue in his legs with some dyspnea on more than routine physical exertion.  Recheck 2D echo and a Zio patch monitor.  Shortly after starting the Zio patch monitor became clear that he was having high-grade block and was referred urgently to Dr. Daneil Dunker from EP who saw him on March 4.  Zio patch indicated heart rates in the 30s with 2-1 block.  He did note more fatigue and limited energy levels.  => PPM placed 04/15/2023 by Dr. Carolynne Citron, Medtronic device.      Sean Robertson was most recently seen by Mr. Tillery from EP for post PPM follow-up.  He was doing well.  No major issues.  No chest pain pressure or dyspnea.  Subjective  Discussed the use of AI scribe software for clinical note transcription with the patient, who gave verbal consent to proceed.  History of Present Illness   Cardiovascular ROS: {roscv:310661}  ROS:  Review of Systems -  {ros master:310782}    Objective    Studies Reviewed: Aaron Aas        ECHO: EF 55 to 60%.  No RWMA.  Mild LVH.  Normal diastolic pressures.  Normal RV.  Mild MR.  Mild AoV calcification-sclerosis with no stenosis.  Normal RAP and RVP.  (04/15/2023)  MONITOR: 2-1 block noted with heart rate down into the 30s.   Risk Assessment/Calculations:     No BP recorded.  {Refresh Note OR Click here to enter BP  :1}***        Physical Exam:   VS:  There were no vitals taken for this visit.   Wt Readings from Last 3 Encounters:  07/19/23 170 lb (77.1 kg)  04/15/23 172 lb (78 kg)  04/12/23 169 lb (76.7 kg)    GEN: Well nourished, well developed in no acute distress; *** NECK: No JVD; No carotid bruits CARDIAC: Normal S1, S2; RRR, no murmurs, rubs, gallops RESPIRATORY:  Clear to auscultation without rales, wheezing or rhonchi ; nonlabored, good air movement. ABDOMEN: Soft, non-tender, non-distended EXTREMITIES:  No edema; No deformity      ASSESSMENT AND PLAN: .    Problem List Items Addressed This Visit   None   Assessment and Plan Assessment & Plan        {Are you ordering a CV Procedure (e.g. stress test, cath, DCCV, TEE, etc)?   Press F2        :  [id number]}   Follow-Up: No follow-ups on file.  Total time spent: *** min spent with patient + *** min spent charting = *** min    Signed, Arleen Lacer, MD, MS Randene Bustard, M.D., M.S. Interventional Chartered certified accountant  Pager # [phone]

## 2023-07-25 ENCOUNTER — Encounter: Payer: Self-pay | Admitting: Cardiology

## 2023-07-31 ENCOUNTER — Ambulatory Visit: Payer: Self-pay | Admitting: Cardiology

## 2023-08-04 ENCOUNTER — Telehealth: Payer: Self-pay | Admitting: Cardiology

## 2023-08-04 NOTE — Telephone Encounter (Signed)
 Pt dropped off DOT physical letter.   Location: Dr. Anner mailbox

## 2023-08-10 ENCOUNTER — Encounter: Payer: Self-pay | Admitting: Cardiology

## 2023-08-10 ENCOUNTER — Ambulatory Visit: Attending: Cardiology | Admitting: Cardiology

## 2023-08-10 VITALS — BP 128/62 | HR 61 | Ht 68.0 in | Wt 168.0 lb

## 2023-08-10 DIAGNOSIS — R931 Abnormal findings on diagnostic imaging of heart and coronary circulation: Secondary | ICD-10-CM | POA: Diagnosis not present

## 2023-08-10 DIAGNOSIS — E1169 Type 2 diabetes mellitus with other specified complication: Secondary | ICD-10-CM

## 2023-08-10 DIAGNOSIS — N1831 Chronic kidney disease, stage 3a: Secondary | ICD-10-CM

## 2023-08-10 DIAGNOSIS — I441 Atrioventricular block, second degree: Secondary | ICD-10-CM | POA: Diagnosis not present

## 2023-08-10 DIAGNOSIS — I10 Essential (primary) hypertension: Secondary | ICD-10-CM

## 2023-08-10 DIAGNOSIS — E785 Hyperlipidemia, unspecified: Secondary | ICD-10-CM

## 2023-08-10 NOTE — Patient Instructions (Signed)
Medication Instructions:  ?No changes ? ? ?*If you need a refill on your cardiac medications before your next appointment, please call your pharmacy* ? ? ?Lab Work: ?Not needed ? ? ? ?Testing/Procedures: ?Not needed ? ? ?Follow-Up: ?At [facility], you and your health needs are our priority.  As part of our continuing mission to provide you with exceptional heart care, we have created designated Provider Care Teams.  These Care Teams include your primary Cardiologist (physician) and Advanced Practice Providers (APPs -  Physician Assistants and Nurse Practitioners) who all work together to provide you with the care you need, when you need it. ? ?  ? ?Your next appointment:   ?8 month(s) ? ?The format for your next appointment:   ?In Person ? ?Provider:   ?David Harding, MD  ? ? ? ?

## 2023-08-10 NOTE — Progress Notes (Signed)
 Cardiology Office Note:  .   Date:  08/13/2023  ID:  Sean Robertson, DOB 10-Jul-1945, MRN [id number] PCP: Windy Coy, MD  [facility] Providers Cardiologist:  Alm Clay, MD Electrophysiologist:  Fonda Kitty, MD     Chief Complaint  Patient presents with   Hospitalization Follow-up    Post pacemaker placement    Patient Profile: .     Sean Robertson is a  78 y.o. male  with a PMH notable for high-grade AV block-s/p PPM, HTN, DM-2, HLD, CKD 3 as well as COPD who presents here for 4-5 month follow-up at the request of Windy Coy, MD.  Sean Robertson was initially referred at the request of Windy Coy, M/D for BRADYCARDIA - MOBITZ II AV BLOCK.  I saw him on February 12 f and plan for Zio patch monitor.  Evaluate the severity of his bradycardia and potential pauses potential high-grade AV block.  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  No sensation of lightheadedness or dizziness.  Occasional heartburn was the only symptom he noted other than exercise intolerance noting fatigue in his legs with some dyspnea on more than routine physical exertion.  Recheck 2D echo and a Zio patch monitor.  Shortly after starting the Zio patch monitor became clear that he was having high-grade block and was referred urgently to Dr. Kitty from EP who saw him on March 4.  Zio patch indicated heart rates in the 30s with 2-1 block.  He did note more fatigue and limited energy levels.  => PPM placed 04/15/2023 by Dr. Waddell, Medtronic device.      Sean Robertson was just seen by M.  Prentice Passey, PA on 07/19/2023 for post PPM follow-up.  Doing well.  No palpitations.  No fatigue.  Energy level improved.  PPM interrogation look normal.  Wound stable.  BP stable.  Plan was annual follow-up with Dr. Kitty.  Subjective  Discussed the use of AI scribe software for clinical note transcription with the patient, who gave verbal consent to  proceed.  History of Present Illness History of Present Illness Sean Robertson is a 78 year old male who presents for follow-up after pacemaker placement.  He had a Medtronic dual chamber pacemaker placed on May 05, 2023, due to symptomatic bradycardia caused by a 2:1 block. Since the pacemaker placement, he has experienced improved energy levels and has not had any episodes of syncope or bradycardia.  A coronary calcium score revealed a score of 410, indicating significant atherosclerosis with even distribution in the LAD, circumflex, and right coronary artery. The scan also showed mild emphysema, mild fatty liver, and atherosclerosis in the aorta and coronaries. No chest pain, pressure, tightness, shortness of breath, heart racing, leg swelling, dizziness, or lightheadedness.  His last cholesterol check was in March 2025, but specific levels were unavailable during this visit. He is currently taking atorvastatin 40 mg and Zetia for cholesterol management. He is on amlodipine 5 mg daily, HCTZ 12.5 mg daily, and losartan 50 mg daily for blood pressure. For diabetes, he is on Jardiance and Mounjaro, with a last A1c of 7.0 and glucose of 221 mg/dL. He has renal insufficiency with a creatinine of 1.84 mg/dL, but his potassium and bicarbonate levels are normal.  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 Should be no issue now that he is status  post pacemaker placement.     Objective    Studies Reviewed: .        Results LABS A1c: 7 (04/2023) Glucose: 221 (04/2023) Creatinine: 1.84 (04/2023) Potassium: 4.2 (04/2023) Bicarbonate: 26 (04/2023)  No results found for: CHOL, HDL, LDLCALC, LDLDIRECT, TRIG, CHOLHDL   RADIOLOGY Coronary Calcium Score: 410 (LAD 124, LCx 137, RCA 120), mild emphysema, mild hepatic steatosis, atherosclerosis in aorta and coronary  arteries  PROCEDURE: PPM Placement 04/15/2023   No results found for: CHOL, HDL, LDLCALC, LDLDIRECT, TRIG, CHOLHDL   Risk Assessment/Calculations:         Physical Exam:   VS:  BP 128/62   Pulse 61   Ht 5' 8 (1.727 m)   Wt 168 lb (76.2 kg)   SpO2 97%   BMI 25.54 kg/m    Wt Readings from Last 3 Encounters:  08/10/23 168 lb (76.2 kg)  07/19/23 170 lb (77.1 kg)  04/15/23 172 lb (78 kg)    GEN: Well nourished, well developed in no acute distress; healthy-appearing.  Well-groomed. NECK: No JVD; No carotid bruits CARDIAC: Normal S1, S2; RRR, no murmurs, rubs, gallops RESPIRATORY:  Clear to auscultation without rales, wheezing or rhonchi ; nonlabored, good air movement. ABDOMEN: Soft, non-tender, non-distended EXTREMITIES:  No edema; No deformity      ASSESSMENT AND PLAN: .    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 No angina or heart failure symptoms. On atorvastatin and Zetia. Aspirin recommended due to high calcium score. Normal EF on Echo with no RWMA.SABRA - Initiate aspirin 81 mg daily. - Continue amlodipine for antianginal benefit and blood pressure along with combination losartan 50 mg, HCTZ 12.5 mg for BP control. -Has not been on beta-blocker because of bradycardia, but without symptoms, no indication.  - Continue atorvastatin 40 mg and Zetia.  - Ensure cholesterol levels are monitored by primary care. - Coordinate with primary care for ongoing management of cardiovascular risk factors.      Relevant Medications   amLODipine (NORVASC) 5 MG tablet   Essential hypertension (Chronic)   BP well-controlled. -Continue amlodipine 5 mg daily and losartan 50 mg daily along with HCTZ 12.5 mg daily      Relevant Medications   amLODipine (NORVASC) 5 MG tablet   Hyperlipidemia associated with type 2  diabetes mellitus (HCC) (Chronic)   With Coronary Calcium Score 400, LDL target<70. Lipids not available.  Being followed by PCP. Will monitor for now on atorvastatin 40 mg daily and Zetia 10 mg daily.   A1c was 7.0 in March, indicating suboptimal control. On Jardiance and Mounjaro. Coordination with primary care necessary. - Coordinate with primary care for diabetes management. - Ensure regular monitoring of A1c and blood glucose levels.      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 SABRA Now status post Medtronic dual chamber pacemaker. Pacemaker functioning well, preventing symptoms and allowing driving clearance. - Ensure pacemaker is functioning properly. 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       Relevant Medications   amLODipine (NORVASC) 5 MG tablet     Other   CKD stage 3a, GFR 45-59 ml/min (HCC) (Chronic)   Renal insufficiency with creatinine at  1.84. Normal potassium and bicarbonate levels. Ongoing monitoring necessary. - Monitor renal function regularly. - Coordinate with primary care for management.               Follow-Up: Return in about 8 months (around 04/09/2024) for Routine follow up with me, [facility] [address] Office. - Follow up in March      Signed, Alm MICAEL Clay, MD, MS Alm Clay, M.D., M.S. Interventional Chartered certified accountant  Pager # [phone]

## 2023-08-11 NOTE — Telephone Encounter (Signed)
 Patient received letter for DOT requested by [employer] at office visit on 7/32/25. RN also faxed a copy of letter via Epic - routing.

## 2023-08-13 ENCOUNTER — Encounter: Payer: Self-pay | Admitting: Cardiology

## 2023-08-13 DIAGNOSIS — R931 Abnormal findings on diagnostic imaging of heart and coronary circulation: Secondary | ICD-10-CM | POA: Insufficient documentation

## 2023-08-13 NOTE — Assessment & Plan Note (Signed)
 Coronary calcium score of 410 indicates significant atherosclerosis. No angina or heart failure symptoms. On atorvastatin and Zetia. Aspirin recommended due to high calcium score. Normal EF on Echo with no RWMA.SABRA - Initiate aspirin 81 mg daily. - Continue amlodipine for antianginal benefit and blood pressure along with combination losartan 50 mg, HCTZ 12.5 mg for BP control. -Has not been on beta-blocker because of bradycardia, but without symptoms, no indication.  - Continue atorvastatin 40 mg and Zetia.  - Ensure cholesterol levels are monitored by primary care. - Coordinate with primary care for ongoing management of cardiovascular risk factors.

## 2023-08-13 NOTE — Assessment & Plan Note (Signed)
 With Coronary Calcium Score 400, LDL target<70. Lipids not available.  Being followed by PCP. Will monitor for now on atorvastatin 40 mg daily and Zetia 10 mg daily.   A1c was 7.0 in March, indicating suboptimal control. On Jardiance and Mounjaro. Coordination with primary care necessary. - Coordinate with primary care for diabetes management. - Ensure regular monitoring of A1c and blood glucose levels.

## 2023-08-13 NOTE — Assessment & Plan Note (Signed)
 BP well-controlled. -Continue amlodipine 5 mg daily and losartan 50 mg daily along with HCTZ 12.5 mg daily

## 2023-08-13 NOTE — Assessment & Plan Note (Signed)
 Renal insufficiency with creatinine at 1.84. Normal potassium and bicarbonate levels. Ongoing monitoring necessary. - Monitor renal function regularly. - Coordinate with primary care for management.

## 2023-08-13 NOTE — Assessment & Plan Note (Signed)
 Symptomatic bradycardia with Mobitz type II. SABRA Now status post Medtronic dual chamber pacemaker. Pacemaker functioning well, preventing symptoms and allowing driving clearance. - Ensure pacemaker is functioning properly. 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

## 2023-08-30 ENCOUNTER — Ambulatory Visit

## 2023-08-30 DIAGNOSIS — I441 Atrioventricular block, second degree: Secondary | ICD-10-CM | POA: Diagnosis not present

## 2023-08-31 ENCOUNTER — Ambulatory Visit: Payer: Self-pay | Admitting: Cardiology

## 2023-08-31 LAB — CUP PACEART REMOTE DEVICE CHECK
Battery Remaining Longevity: 149 mo
Battery Voltage: 3.18 V
Brady Statistic AP VP Percent: 48.6 %
Brady Statistic AP VS Percent: 0.09 %
Brady Statistic AS VP Percent: 50.25 %
Brady Statistic AS VS Percent: 1.05 %
Brady Statistic RA Percent Paced: 49.16 %
Brady Statistic RV Percent Paced: 98.86 %
Date Time Interrogation Session: 20250722003522
Implantable Lead Connection Status: 753985
Implantable Lead Connection Status: 753985
Implantable Lead Implant Date: 20250307
Implantable Lead Implant Date: 20250307
Implantable Lead Location: 753859
Implantable Lead Location: 753860
Implantable Lead Model: 3830
Implantable Lead Model: 5076
Implantable Pulse Generator Implant Date: 20250307
Lead Channel Impedance Value: 361 Ohm
Lead Channel Impedance Value: 437 Ohm
Lead Channel Impedance Value: 456 Ohm
Lead Channel Impedance Value: 684 Ohm
Lead Channel Pacing Threshold Amplitude: 0.625 V
Lead Channel Pacing Threshold Amplitude: 0.625 V
Lead Channel Pacing Threshold Pulse Width: 0.4 ms
Lead Channel Pacing Threshold Pulse Width: 0.4 ms
Lead Channel Sensing Intrinsic Amplitude: 3.25 mV
Lead Channel Sensing Intrinsic Amplitude: 3.25 mV
Lead Channel Sensing Intrinsic Amplitude: 31.625 mV
Lead Channel Sensing Intrinsic Amplitude: 31.625 mV
Lead Channel Setting Pacing Amplitude: 1.5 V
Lead Channel Setting Pacing Amplitude: 2 V
Lead Channel Setting Pacing Pulse Width: 0.4 ms
Lead Channel Setting Sensing Sensitivity: 1.2 mV
Zone Setting Status: 755011
Zone Setting Status: 755011

## 2023-11-11 NOTE — Progress Notes (Signed)
 Remote PPM Transmission

## 2023-11-29 ENCOUNTER — Ambulatory Visit

## 2023-11-29 DIAGNOSIS — I441 Atrioventricular block, second degree: Secondary | ICD-10-CM | POA: Diagnosis not present

## 2023-12-01 LAB — CUP PACEART REMOTE DEVICE CHECK
Battery Remaining Longevity: 144 mo
Battery Voltage: 3.15 V
Brady Statistic AP VP Percent: 54.06 %
Brady Statistic AP VS Percent: 0.05 %
Brady Statistic AS VP Percent: 45.28 %
Brady Statistic AS VS Percent: 0.61 %
Brady Statistic RA Percent Paced: 54.4 %
Brady Statistic RV Percent Paced: 99.34 %
Date Time Interrogation Session: 20251020223312
Implantable Lead Connection Status: 753985
Implantable Lead Connection Status: 753985
Implantable Lead Implant Date: 20250307
Implantable Lead Implant Date: 20250307
Implantable Lead Location: 753859
Implantable Lead Location: 753860
Implantable Lead Model: 3830
Implantable Lead Model: 5076
Implantable Pulse Generator Implant Date: 20250307
Lead Channel Impedance Value: 342 Ohm
Lead Channel Impedance Value: 437 Ohm
Lead Channel Impedance Value: 589 Ohm
Lead Channel Impedance Value: 608 Ohm
Lead Channel Pacing Threshold Amplitude: 0.75 V
Lead Channel Pacing Threshold Amplitude: 0.875 V
Lead Channel Pacing Threshold Pulse Width: 0.4 ms
Lead Channel Pacing Threshold Pulse Width: 0.4 ms
Lead Channel Sensing Intrinsic Amplitude: 2.25 mV
Lead Channel Sensing Intrinsic Amplitude: 2.25 mV
Lead Channel Sensing Intrinsic Amplitude: 31.625 mV
Lead Channel Sensing Intrinsic Amplitude: 31.625 mV
Lead Channel Setting Pacing Amplitude: 1.75 V
Lead Channel Setting Pacing Amplitude: 2 V
Lead Channel Setting Pacing Pulse Width: 0.4 ms
Lead Channel Setting Sensing Sensitivity: 1.2 mV
Zone Setting Status: 755011
Zone Setting Status: 755011

## 2023-12-02 NOTE — Progress Notes (Signed)
 Remote PPM Transmission

## 2023-12-03 ENCOUNTER — Ambulatory Visit: Payer: Self-pay | Admitting: Cardiology

## 2024-02-28 ENCOUNTER — Ambulatory Visit

## 2024-02-28 DIAGNOSIS — I441 Atrioventricular block, second degree: Secondary | ICD-10-CM

## 2024-03-01 LAB — CUP PACEART REMOTE DEVICE CHECK
Battery Remaining Longevity: 138 mo
Battery Voltage: 3.1 V
Brady Statistic AP VP Percent: 47.52 %
Brady Statistic AP VS Percent: 0.04 %
Brady Statistic AS VP Percent: 51.68 %
Brady Statistic AS VS Percent: 0.77 %
Brady Statistic RA Percent Paced: 47.97 %
Brady Statistic RV Percent Paced: 99.19 %
Date Time Interrogation Session: 20260120172559
Implantable Lead Connection Status: 753985
Implantable Lead Connection Status: 753985
Implantable Lead Implant Date: 20250307
Implantable Lead Implant Date: 20250307
Implantable Lead Location: 753859
Implantable Lead Location: 753860
Implantable Lead Model: 3830
Implantable Lead Model: 5076
Implantable Pulse Generator Implant Date: 20250307
Lead Channel Impedance Value: 323 Ohm
Lead Channel Impedance Value: 418 Ohm
Lead Channel Impedance Value: 513 Ohm
Lead Channel Impedance Value: 608 Ohm
Lead Channel Pacing Threshold Amplitude: 0.75 V
Lead Channel Pacing Threshold Amplitude: 1 V
Lead Channel Pacing Threshold Pulse Width: 0.4 ms
Lead Channel Pacing Threshold Pulse Width: 0.4 ms
Lead Channel Sensing Intrinsic Amplitude: 3.125 mV
Lead Channel Sensing Intrinsic Amplitude: 3.125 mV
Lead Channel Sensing Intrinsic Amplitude: 31.625 mV
Lead Channel Sensing Intrinsic Amplitude: 31.625 mV
Lead Channel Setting Pacing Amplitude: 2 V
Lead Channel Setting Pacing Amplitude: 2 V
Lead Channel Setting Pacing Pulse Width: 0.4 ms
Lead Channel Setting Sensing Sensitivity: 1.2 mV
Zone Setting Status: 755011
Zone Setting Status: 755011

## 2024-03-02 NOTE — Progress Notes (Signed)
 Remote PPM Transmission

## 2024-03-05 ENCOUNTER — Ambulatory Visit: Payer: Self-pay | Admitting: Cardiology

## 2024-05-29 ENCOUNTER — Encounter

## 2024-08-28 ENCOUNTER — Encounter

## 2024-11-27 ENCOUNTER — Encounter

## 2025-02-26 ENCOUNTER — Encounter
